# Patient Record
Sex: Female | Born: 1945 | Race: White | Hispanic: No | Smoking: Never smoker
Health system: Southern US, Community
[De-identification: ages and names within clinical notes are randomized; demographics above are authoritative.]

## PROBLEM LIST (undated history)

## (undated) DIAGNOSIS — I10 Essential (primary) hypertension: Secondary | ICD-10-CM

## (undated) DIAGNOSIS — E785 Hyperlipidemia, unspecified: Secondary | ICD-10-CM

---

## 2007-06-30 ENCOUNTER — Emergency Department (HOSPITAL_COMMUNITY): Admission: EM | Admit: 2007-06-30 | Discharge: 2007-06-30 | Payer: Self-pay | Admitting: Emergency Medicine

## 2009-04-18 ENCOUNTER — Encounter: Admission: RE | Admit: 2009-04-18 | Discharge: 2009-04-18 | Payer: Self-pay | Admitting: Family Medicine

## 2012-09-06 ENCOUNTER — Ambulatory Visit (INDEPENDENT_AMBULATORY_CARE_PROVIDER_SITE_OTHER): Payer: Self-pay | Admitting: Ophthalmology

## 2012-09-14 ENCOUNTER — Ambulatory Visit (INDEPENDENT_AMBULATORY_CARE_PROVIDER_SITE_OTHER): Payer: Medicare Other | Admitting: Ophthalmology

## 2012-09-14 DIAGNOSIS — H35039 Hypertensive retinopathy, unspecified eye: Secondary | ICD-10-CM

## 2012-09-14 DIAGNOSIS — I10 Essential (primary) hypertension: Secondary | ICD-10-CM

## 2012-09-14 DIAGNOSIS — H348392 Tributary (branch) retinal vein occlusion, unspecified eye, stable: Secondary | ICD-10-CM

## 2012-09-14 DIAGNOSIS — H2510 Age-related nuclear cataract, unspecified eye: Secondary | ICD-10-CM

## 2012-09-14 DIAGNOSIS — H3530 Unspecified macular degeneration: Secondary | ICD-10-CM

## 2012-09-14 DIAGNOSIS — H43819 Vitreous degeneration, unspecified eye: Secondary | ICD-10-CM

## 2013-10-03 ENCOUNTER — Ambulatory Visit (INDEPENDENT_AMBULATORY_CARE_PROVIDER_SITE_OTHER): Payer: PRIVATE HEALTH INSURANCE | Admitting: Ophthalmology

## 2013-10-18 ENCOUNTER — Ambulatory Visit (INDEPENDENT_AMBULATORY_CARE_PROVIDER_SITE_OTHER): Payer: Medicare Other | Admitting: Ophthalmology

## 2013-10-18 DIAGNOSIS — H348392 Tributary (branch) retinal vein occlusion, unspecified eye, stable: Secondary | ICD-10-CM

## 2013-10-18 DIAGNOSIS — I10 Essential (primary) hypertension: Secondary | ICD-10-CM

## 2013-10-18 DIAGNOSIS — H35039 Hypertensive retinopathy, unspecified eye: Secondary | ICD-10-CM

## 2013-10-18 DIAGNOSIS — H35379 Puckering of macula, unspecified eye: Secondary | ICD-10-CM

## 2013-10-18 DIAGNOSIS — H43819 Vitreous degeneration, unspecified eye: Secondary | ICD-10-CM

## 2013-11-02 ENCOUNTER — Emergency Department (HOSPITAL_COMMUNITY)
Admission: EM | Admit: 2013-11-02 | Discharge: 2013-11-02 | Disposition: A | Discharge status: Home / Self Care | Payer: Medicare Other | Attending: Emergency Medicine | Admitting: Emergency Medicine

## 2013-11-02 ENCOUNTER — Encounter (HOSPITAL_COMMUNITY): Payer: Self-pay | Admitting: Emergency Medicine

## 2013-11-02 DIAGNOSIS — I10 Essential (primary) hypertension: Secondary | ICD-10-CM | POA: Insufficient documentation

## 2013-11-02 DIAGNOSIS — R1110 Vomiting, unspecified: Secondary | ICD-10-CM

## 2013-11-02 DIAGNOSIS — R5381 Other malaise: Secondary | ICD-10-CM | POA: Insufficient documentation

## 2013-11-02 DIAGNOSIS — IMO0002 Reserved for concepts with insufficient information to code with codable children: Secondary | ICD-10-CM | POA: Insufficient documentation

## 2013-11-02 DIAGNOSIS — R55 Syncope and collapse: Secondary | ICD-10-CM | POA: Insufficient documentation

## 2013-11-02 DIAGNOSIS — Z79899 Other long term (current) drug therapy: Secondary | ICD-10-CM | POA: Insufficient documentation

## 2013-11-02 DIAGNOSIS — R197 Diarrhea, unspecified: Secondary | ICD-10-CM | POA: Insufficient documentation

## 2013-11-02 DIAGNOSIS — R112 Nausea with vomiting, unspecified: Secondary | ICD-10-CM | POA: Insufficient documentation

## 2013-11-02 DIAGNOSIS — R5383 Other fatigue: Secondary | ICD-10-CM

## 2013-11-02 HISTORY — DX: Essential (primary) hypertension: I10

## 2013-11-02 LAB — CBC
HEMATOCRIT: 43.7 % (ref 36.0–46.0)
HEMOGLOBIN: 14.9 g/dL (ref 12.0–15.0)
MCH: 30.9 pg (ref 26.0–34.0)
MCHC: 34.1 g/dL (ref 30.0–36.0)
MCV: 90.7 fL (ref 78.0–100.0)
Platelets: 206 10*3/uL (ref 150–400)
RBC: 4.82 MIL/uL (ref 3.87–5.11)
RDW: 12.9 % (ref 11.5–15.5)
WBC: 14.3 10*3/uL — ABNORMAL HIGH (ref 4.0–10.5)

## 2013-11-02 LAB — POCT I-STAT TROPONIN I: Troponin i, poc: 0 ng/mL (ref 0.00–0.08)

## 2013-11-02 LAB — BASIC METABOLIC PANEL
BUN: 20 mg/dL (ref 6–23)
CALCIUM: 9.7 mg/dL (ref 8.4–10.5)
CO2: 27 meq/L (ref 19–32)
CREATININE: 1.16 mg/dL — AB (ref 0.50–1.10)
Chloride: 102 mEq/L (ref 96–112)
GFR calc Af Amer: 54 mL/min — ABNORMAL LOW (ref >90)
GFR, EST NON AFRICAN AMERICAN: 47 mL/min — AB (ref >90)
GLUCOSE: 100 mg/dL — AB (ref 70–99)
Potassium: 4.3 mEq/L (ref 3.7–5.3)
Sodium: 143 mEq/L (ref 137–147)

## 2013-11-02 MED ORDER — ONDANSETRON HCL 4 MG/2ML IJ SOLN
4.0000 mg | Freq: Once | INTRAMUSCULAR | Status: DC
Start: 2013-11-02 — End: 2013-11-02

## 2013-11-02 MED ORDER — ONDANSETRON 4 MG PO TBDP: 4.0000 mg | ORAL_TABLET | Freq: Three times a day (TID) | ORAL | Status: DC | PRN

## 2013-11-02 NOTE — ED Notes (Signed)
EKG given to Dr.Horton. °

## 2013-11-02 NOTE — ED Notes (Signed)
Presents with syncopal episode occurred while sitting on toilet and having diarrhea, associated with nausea, vomiting. Also reports productive cough. States, "I felt dizzy and all of asudden felt like I was going to pass out, then had the runs" alert, oriented and MAEx4

## 2013-11-02 NOTE — Discharge Instructions (Signed)
Follow up with your family doctor, return for worsening weakness, repeat episode.  Nausea and Vomiting Nausea means you feel sick to your stomach. Throwing up (vomiting) is a reflex where stomach contents come out of your mouth. HOME CARE   Take medicine as told by your doctor.  Do not force yourself to eat. However, you do need to drink fluids.  If you feel like eating, eat a normal diet as told by your doctor.  Eat rice, wheat, potatoes, bread, lean meats, yogurt, fruits, and vegetables.  Avoid high-fat foods.  Drink enough fluids to keep your pee (urine) clear or pale yellow.  Ask your doctor how to replace body fluid losses (rehydrate). Signs of body fluid loss (dehydration) include:  Feeling very thirsty.  Dry lips and mouth.  Feeling dizzy.  Dark pee.  Peeing less than normal.  Feeling confused.  Fast breathing or heart rate. GET HELP RIGHT AWAY IF:   You have blood in your throw up.  You have black or bloody poop (stool).  You have a bad headache or stiff neck.  You feel confused.  You have bad belly (abdominal) pain.  You have chest pain or trouble breathing.  You do not pee at least once every 8 hours.  You have cold, clammy skin.  You keep throwing up after 24 to 48 hours.  You have a fever. MAKE SURE YOU:   Understand these instructions.  Will watch your condition.  Will get help right away if you are not doing well or get worse. Document Released: 03/02/2008 Document Revised: 12/07/2011 Document Reviewed: 02/13/2011 [facility] Patient Information 2014 [employer], [place].

## 2013-11-02 NOTE — ED Provider Notes (Signed)
CSN: [id number]     Arrival date & time 11/02/13  1713 History   First MD Initiated Contact with Patient 11/02/13 1728     Chief Complaint  Patient presents with  . Loss of Consciousness   (Consider location/radiation/quality/duration/timing/severity/associated sxs/prior Treatment) Patient is a 68 y.o. female presenting with syncope. The history is provided by the patient, a relative and the spouse.  Loss of Consciousness Episode history:  Multiple Most recent episode:  Today Duration:  2 seconds Timing:  Intermittent Progression:  Partially resolved Chronicity:  Recurrent Context: bowel movement   Context: not blood draw   Witnessed: yes   Relieved by:  Lying down Worsened by:  Nothing tried Ineffective treatments:  None tried Associated symptoms: malaise/fatigue, nausea, vomiting and weakness   Associated symptoms: no anxiety, no chest pain, no diaphoresis, no dizziness, no fever, no focal weakness, no headaches, no palpitations, no seizures and no shortness of breath   Risk factors: no congenital heart disease, no coronary artery disease and no seizures     68 year old female with chief complaint of syncope. Patient was having vomiting and diarrhea which started abruptly today around 3:00 had this episode she was sitting on the toilet having a bowel movement when she suddenly fell if she is to pass out and then did. Patient denies any chest pain or shortness of breath with this. Patient then had a repeat episode about 10 minutes later when having another bowel movement. Patient with no fevers or chills no suspicious food intake. Patient denies any coffee-ground emesis or melena. Patient with no history of heart failure. Patient denies any fevers chills. Patient is having a small cough with this illness. Patient denies any abdominal pain.  Past Medical History  Diagnosis Date  . Hypertension    History reviewed. No pertinent past surgical history. History reviewed. No pertinent  family history. History  Substance Use Topics  . Smoking status: Never Smoker   . Smokeless tobacco: Not on file  . Alcohol Use: No   OB History   Grav Para Term Preterm Abortions TAB SAB Ect Mult Living                 Review of Systems  Constitutional: Positive for malaise/fatigue. Negative for fever, chills and diaphoresis.  HENT: Negative for congestion and rhinorrhea.   Eyes: Negative for redness and visual disturbance.  Respiratory: Negative for shortness of breath and wheezing.   Cardiovascular: Positive for syncope. Negative for chest pain and palpitations.  Gastrointestinal: Positive for nausea, vomiting and diarrhea.  Genitourinary: Negative for dysuria and urgency.  Musculoskeletal: Negative for arthralgias and myalgias.  Skin: Negative for pallor and wound.  Neurological: Positive for syncope and weakness. Negative for dizziness, focal weakness, seizures and headaches.    Allergies  Bee venom and Latex  Home Medications   Current Outpatient Rx  Name  Route  Sig  Dispense  Refill  . ALPRAZolam (XANAX) 0.25 MG tablet   Oral   Take 0.25 mg by mouth 3 (three) times daily as needed for anxiety.         Marland Kitchen. amLODipine (NORVASC) 2.5 MG tablet   Oral   Take 2.5 mg by mouth daily.         Marland Kitchen. EPINEPHrine (EPIPEN) 0.3 mg/0.3 mL SOAJ injection   Intramuscular   Inject 0.3 mg into the muscle once.         . hydrocortisone 2.5 % cream   Topical   Apply 1 application topically 3 (three) times  daily.         . latanoprost (XALATAN) 0.005 % ophthalmic solution   Both Eyes   Place 1 drop into both eyes at bedtime.         Marland Kitchen levocetirizine (XYZAL) 5 MG tablet   Oral   Take 5 mg by mouth every evening.         . magnesium oxide (MAG-OX) 400 MG tablet   Oral   Take 400 mg by mouth daily.         Marland Kitchen omeprazole (PRILOSEC) 20 MG capsule   Oral   Take 20 mg by mouth 2 (two) times daily before a meal.         . rosuvastatin (CRESTOR) 10 MG tablet    Oral   Take 10 mg by mouth daily.         Marland Kitchen terbinafine (LAMISIL) 250 MG tablet   Oral   Take 250 mg by mouth daily.         Marland Kitchen tolterodine (DETROL) 2 MG tablet   Oral   Take 2 mg by mouth at bedtime.         . ondansetron (ZOFRAN ODT) 4 MG disintegrating tablet   Oral   Take 1 tablet (4 mg total) by mouth every 8 (eight) hours as needed for nausea or vomiting.   20 tablet   0    BP 125/70  Pulse 46  Temp(Src) 97.9 F (36.6 C) (Oral)  Resp 26  SpO2 95% Physical Exam  Constitutional: She is oriented to person, place, and time. She appears well-developed and well-nourished. No distress.  HENT:  Head: Normocephalic and atraumatic.  Eyes: EOM are normal. Pupils are equal, round, and reactive to light.  Neck: Normal range of motion. Neck supple.  Cardiovascular: Normal rate and regular rhythm.  Exam reveals no gallop and no friction rub.   No murmur heard. Pulmonary/Chest: Effort normal. She has no wheezes. She has no rales.  Abdominal: Soft. She exhibits no distension. There is no tenderness.  Musculoskeletal: She exhibits no edema and no tenderness.  Neurological: She is alert and oriented to person, place, and time.  Skin: Skin is warm and dry. She is not diaphoretic.  Psychiatric: She has a normal mood and affect. Her behavior is normal.    ED Course  Procedures (including critical care time) Labs Review Labs Reviewed  CBC - Abnormal; Notable for the following:    WBC 14.3 (*)    All other components within normal limits  BASIC METABOLIC PANEL - Abnormal; Notable for the following:    Glucose, Bld 100 (*)    Creatinine, Ser 1.16 (*)    GFR calc non Af Amer 47 (*)    GFR calc Af Amer 54 (*)    All other components within normal limits  POCT I-STAT TROPONIN I   Imaging Review No results found.  EKG Interpretation    Date/Time:  Thursday November 02 2013 17:26:54 EST Ventricular Rate:  78 PR Interval:  190 QRS Duration: 89 QT Interval:  402 QTC  Calculation: 458 R Axis:   -50 Text Interpretation:  Sinus rhythm Inferior infarct, old no prior for comparison Confirmed by HORTON  MD, COURTNEY ([id number]) on 11/02/2013 6:39:10 PM            MDM   1. Syncope   2. Vomiting and diarrhea     68 year old female with a syncopal event. Most likely vasovagal per history. Patient with no history of heart failure we'll  obtain electrolytes orthostatics give fluid bolus, check CBC.  Mild leukocytosis. No noted anemia. Patient with creatinine 1.16. Patient not orthostatic. Patient states feels much better after a bolus of fluid. We will discharge the patient home with Zofran she will follow up with her PCP in one week.  7:29 PM:  I have discussed the diagnosis/risks/treatment options with the patient and family and believe the pt to be eligible for discharge home to follow-up with PCP. We also discussed returning to the ED immediately if new or worsening sx occur. We discussed the sx which are most concerning (e.g., repeat syncopal event) that necessitate immediate return. Medications administered to the patient during their visit and any new prescriptions provided to the patient are listed below.  Medications given during this visit Medications  ondansetron (ZOFRAN) injection 4 mg (not administered)    New Prescriptions   ONDANSETRON (ZOFRAN ODT) 4 MG DISINTEGRATING TABLET    Take 1 tablet (4 mg total) by mouth every 8 (eight) hours as needed for nausea or vomiting.      Melene Plan, MD 11/02/13 [phone]

## 2013-11-02 NOTE — ED Notes (Signed)
At this time pt is not nauseated , she wants to hold off on the zofran for now

## 2013-11-03 NOTE — ED Provider Notes (Signed)
I saw and evaluated the patient, reviewed the resident's note and I agree with the findings and plan.  EKG Interpretation    Date/Time:  Thursday November 02 2013 17:26:54 EST Ventricular Rate:  78 PR Interval:  190 QRS Duration: 89 QT Interval:  402 QTC Calculation: 458 R Axis:   -50 Text Interpretation:  Sinus rhythm Inferior infarct, old no prior for comparison Confirmed by HORTON  MD, COURTNEY ([id number]) on 11/02/2013 6:39:10 PM            Patient presents with syncope.  Patient had onset of vomiting and diarrhea this afternoon.  She was on the toilet having a bowel movement when she had an episode of syncope.  Denies hitting her head.  Denies abdominal pain.  Nontoxic on exam.  No significant abdominal tenderness and neuro intact.  EKG without evidence of arrythmia.  Episode suspicious of vagal episode.  Patient hydrated and feeling better.  Labs notable for crt of 1.6.  Likely prerenal given vomiting and diarrhea.  Will follow-up with PCP.  After history, exam, and medical workup I feel the patient has been appropriately medically screened and is safe for discharge home. Pertinent diagnoses were discussed with the patient. Patient was given return precautions.   Shon Batonourtney F Horton, MD 11/03/13 [phone]

## 2014-10-24 ENCOUNTER — Ambulatory Visit (INDEPENDENT_AMBULATORY_CARE_PROVIDER_SITE_OTHER): Payer: Medicare Other | Admitting: Ophthalmology

## 2014-11-14 ENCOUNTER — Ambulatory Visit (INDEPENDENT_AMBULATORY_CARE_PROVIDER_SITE_OTHER): Payer: Medicare Other | Admitting: Ophthalmology

## 2014-11-14 DIAGNOSIS — H43813 Vitreous degeneration, bilateral: Secondary | ICD-10-CM

## 2014-11-14 DIAGNOSIS — H34832 Tributary (branch) retinal vein occlusion, left eye: Secondary | ICD-10-CM | POA: Diagnosis not present

## 2014-11-14 DIAGNOSIS — I10 Essential (primary) hypertension: Secondary | ICD-10-CM

## 2014-11-14 DIAGNOSIS — H2513 Age-related nuclear cataract, bilateral: Secondary | ICD-10-CM

## 2014-11-14 DIAGNOSIS — H35033 Hypertensive retinopathy, bilateral: Secondary | ICD-10-CM | POA: Diagnosis not present

## 2014-11-14 DIAGNOSIS — H35052 Retinal neovascularization, unspecified, left eye: Secondary | ICD-10-CM

## 2015-03-19 ENCOUNTER — Ambulatory Visit (INDEPENDENT_AMBULATORY_CARE_PROVIDER_SITE_OTHER): Payer: Medicare Other | Admitting: Ophthalmology

## 2015-03-19 DIAGNOSIS — H2513 Age-related nuclear cataract, bilateral: Secondary | ICD-10-CM

## 2015-03-19 DIAGNOSIS — H43813 Vitreous degeneration, bilateral: Secondary | ICD-10-CM

## 2015-03-19 DIAGNOSIS — H35033 Hypertensive retinopathy, bilateral: Secondary | ICD-10-CM

## 2015-03-19 DIAGNOSIS — I10 Essential (primary) hypertension: Secondary | ICD-10-CM

## 2015-03-19 DIAGNOSIS — H34832 Tributary (branch) retinal vein occlusion, left eye: Secondary | ICD-10-CM

## 2015-12-26 DIAGNOSIS — E7800 Pure hypercholesterolemia, unspecified: Secondary | ICD-10-CM | POA: Insufficient documentation

## 2015-12-26 DIAGNOSIS — H9190 Unspecified hearing loss, unspecified ear: Secondary | ICD-10-CM | POA: Insufficient documentation

## 2015-12-26 DIAGNOSIS — N318 Other neuromuscular dysfunction of bladder: Secondary | ICD-10-CM | POA: Insufficient documentation

## 2015-12-26 DIAGNOSIS — Z79899 Other long term (current) drug therapy: Secondary | ICD-10-CM | POA: Insufficient documentation

## 2015-12-26 DIAGNOSIS — K219 Gastro-esophageal reflux disease without esophagitis: Secondary | ICD-10-CM | POA: Insufficient documentation

## 2018-03-09 DIAGNOSIS — F419 Anxiety disorder, unspecified: Secondary | ICD-10-CM | POA: Insufficient documentation

## 2018-09-10 ENCOUNTER — Emergency Department (HOSPITAL_COMMUNITY)
Admission: EM | Admit: 2018-09-10 | Discharge: 2018-09-10 | Disposition: A | Discharge status: Home / Self Care | Payer: Medicare Other | Attending: Emergency Medicine | Admitting: Emergency Medicine

## 2018-09-10 ENCOUNTER — Emergency Department (HOSPITAL_COMMUNITY): Payer: Medicare Other

## 2018-09-10 ENCOUNTER — Other Ambulatory Visit: Payer: Self-pay

## 2018-09-10 ENCOUNTER — Encounter (HOSPITAL_COMMUNITY): Payer: Self-pay

## 2018-09-10 DIAGNOSIS — Y999 Unspecified external cause status: Secondary | ICD-10-CM | POA: Insufficient documentation

## 2018-09-10 DIAGNOSIS — I10 Essential (primary) hypertension: Secondary | ICD-10-CM | POA: Diagnosis not present

## 2018-09-10 DIAGNOSIS — Z79899 Other long term (current) drug therapy: Secondary | ICD-10-CM | POA: Insufficient documentation

## 2018-09-10 DIAGNOSIS — S0990XA Unspecified injury of head, initial encounter: Secondary | ICD-10-CM | POA: Insufficient documentation

## 2018-09-10 DIAGNOSIS — S0181XA Laceration without foreign body of other part of head, initial encounter: Secondary | ICD-10-CM | POA: Diagnosis not present

## 2018-09-10 DIAGNOSIS — Y92008 Other place in unspecified non-institutional (private) residence as the place of occurrence of the external cause: Secondary | ICD-10-CM | POA: Diagnosis not present

## 2018-09-10 DIAGNOSIS — Z23 Encounter for immunization: Secondary | ICD-10-CM | POA: Insufficient documentation

## 2018-09-10 DIAGNOSIS — W108XXA Fall (on) (from) other stairs and steps, initial encounter: Secondary | ICD-10-CM | POA: Insufficient documentation

## 2018-09-10 DIAGNOSIS — Y92009 Unspecified place in unspecified non-institutional (private) residence as the place of occurrence of the external cause: Secondary | ICD-10-CM

## 2018-09-10 DIAGNOSIS — S01112A Laceration without foreign body of left eyelid and periocular area, initial encounter: Secondary | ICD-10-CM

## 2018-09-10 DIAGNOSIS — Y9389 Activity, other specified: Secondary | ICD-10-CM | POA: Insufficient documentation

## 2018-09-10 DIAGNOSIS — W19XXXA Unspecified fall, initial encounter: Secondary | ICD-10-CM

## 2018-09-10 HISTORY — DX: Hyperlipidemia, unspecified: E78.5

## 2018-09-10 MED ORDER — POVIDONE-IODINE 10 % EX SOLN
CUTANEOUS | Status: AC
  Filled 2018-09-10: qty 15

## 2018-09-10 MED ORDER — TETANUS-DIPHTH-ACELL PERTUSSIS 5-2.5-18.5 LF-MCG/0.5 IM SUSP
0.5000 mL | Freq: Once | INTRAMUSCULAR | Status: AC
  Administered 2018-09-10: 0.5 mL via INTRAMUSCULAR
  Filled 2018-09-10: qty 0.5

## 2018-09-10 MED ORDER — LIDOCAINE-EPINEPHRINE (PF) 2 %-1:200000 IJ SOLN
20.0000 mL | Freq: Once | INTRAMUSCULAR | Status: AC
  Administered 2018-09-10: 20 mL
  Filled 2018-09-10: qty 20

## 2018-09-10 NOTE — Discharge Instructions (Addendum)
Keep the wound clean and dry.  You can use antibiotic ointment on the wound to promote healing and lessen scarring.  You can take Tylenol if needed for headache.  Ice packs will help keep the swelling down.  Return to the ED for any problems listed on the head injury sheet otherwise your doctor can remove your sutures on the 18th at your regular appointment.  Recheck sooner if you feel like the wound is getting infected which would be rare because there was no debris in the wound.

## 2018-09-10 NOTE — ED Triage Notes (Signed)
Pt slipped going down 3 steps and fell face-first onto concrete garage floor.  Pt has approx 3 inch laceration to left forehead.  Pt and family deny pt had loc, is awake, alert, oriented at this time. Bleeding controlled at this time

## 2018-09-10 NOTE — ED Provider Notes (Signed)
[facility] EMERGENCY DEPARTMENT Provider Note   CSN: [id number] Arrival date & time: 09/10/18  0127  Time seen 1:40 AM   History   Chief Complaint Chief Complaint  Patient presents with  . Fall    laceration-forehead    HPI Laura Buchanan is a 72 y.o. female.  HPI patient states she was going down 3 steps into the garage and her foot slipped on the last step and she fell forward hitting her head on the cement floor.  Her daughter was in the kitchen and heard her fall and the patient immediately called out for help.  There was no loss of consciousness.  She denies any pain.  She has had a ice pack on her head and states her head still feels numb from that.  She denies headache, neck pain, other extremity pain or other injury from the fall.  She denies pain on any blood thinners other than a baby aspirin a day.  She denies nausea, blurred vision, numbness or tingling of any of her extremities.  She thinks her last tetanus was about 10 years ago.  PCP Dr Benedetto Goad  Past Medical History:  Diagnosis Date  . Hyperlipidemia   . Hypertension     There are no active problems to display for this patient.   History reviewed. No pertinent surgical history.   OB History   No obstetric history on file.      Home Medications    Prior to Admission medications   Medication Sig Start Date End Date Taking? Authorizing Provider  ALPRAZolam (XANAX) 0.25 MG tablet Take 0.25 mg by mouth 3 (three) times daily as needed for anxiety.    [provider]  amLODipine (NORVASC) 2.5 MG tablet Take 2.5 mg by mouth daily.    [provider]  EPINEPHrine (EPIPEN) 0.3 mg/0.3 mL SOAJ injection Inject 0.3 mg into the muscle once.    [provider]  hydrocortisone 2.5 % cream Apply 1 application topically 3 (three) times daily.    [provider]  latanoprost (XALATAN) 0.005 % ophthalmic solution Place 1 drop into both eyes at bedtime.    [provider]    levocetirizine (XYZAL) 5 MG tablet Take 5 mg by mouth every evening.    [provider]  magnesium oxide (MAG-OX) 400 MG tablet Take 400 mg by mouth daily.    [provider]  omeprazole (PRILOSEC) 20 MG capsule Take 20 mg by mouth 2 (two) times daily before a meal.    [provider]  ondansetron (ZOFRAN ODT) 4 MG disintegrating tablet Take 1 tablet (4 mg total) by mouth every 8 (eight) hours as needed for nausea or vomiting. 11/02/13   Melene Plan, DO  rosuvastatin (CRESTOR) 10 MG tablet Take 10 mg by mouth daily.    [provider]  terbinafine (LAMISIL) 250 MG tablet Take 250 mg by mouth daily.    [provider]  tolterodine (DETROL) 2 MG tablet Take 2 mg by mouth at bedtime.    [provider]    Family History No family history on file.  Social History Social History   Tobacco Use  . Smoking status: Never Smoker  . Smokeless tobacco: Never Used  Substance Use Topics  . Alcohol use: No  . Drug use: Never  lives with daughter   Allergies   Bee venom and Latex   Review of Systems Review of Systems  All other systems reviewed and are negative.    Physical  Exam Updated Vital Signs BP (!) 153/89 (BP Location: Left Arm)   Pulse 94   Temp 98 F (36.7 C) (Oral)   Resp 18   Ht 5' (1.524 m)   Wt 72.6 kg   SpO2 93%   BMI 31.25 kg/m   Vital signs normal except hypertension   Physical Exam Vitals signs and nursing note reviewed.  Constitutional:      General: She is not in acute distress.    Appearance: Normal appearance.  HENT:     Head: Normocephalic.     Comments: Patient has a 3 cm linear almost vertically placed laceration on her left forehead that is through the dermis.    Right Ear: External ear normal.     Left Ear: External ear normal.     Nose: Nose normal.     Mouth/Throat:     Mouth: Mucous membranes are moist.     Pharynx: No posterior oropharyngeal erythema.  Eyes:     Extraocular Movements:  Extraocular movements intact.     Conjunctiva/sclera: Conjunctivae normal.     Pupils: Pupils are equal, round, and reactive to light.  Neck:     Musculoskeletal: Normal range of motion and neck supple.     Comments: nontender Cardiovascular:     Rate and Rhythm: Normal rate.  Pulmonary:     Effort: Pulmonary effort is normal. No respiratory distress.  Musculoskeletal: Normal range of motion.        General: No tenderness, deformity or signs of injury.  Skin:    General: Skin is warm and dry.     Capillary Refill: Capillary refill takes 2 to 3 seconds.  Neurological:     General: No focal deficit present.     Mental Status: She is alert and oriented to person, place, and time.  Psychiatric:        Mood and Affect: Mood normal.        Behavior: Behavior normal.        Thought Content: Thought content normal.        ED Treatments / Results  Labs (all labs ordered are listed, but only abnormal results are displayed) Labs Reviewed - No data to display  EKG None  Radiology Ct Head Wo Contrast  Result Date: 09/10/2018 CLINICAL DATA:  Fall striking head on concrete. Fell down 3 steps face first onto concrete garage floor. Laceration to left forehead. EXAM: CT HEAD WITHOUT CONTRAST TECHNIQUE: Contiguous axial images were obtained from the base of the skull through the vertex without intravenous contrast. COMPARISON:  None. FINDINGS: Brain: No intracranial hemorrhage, mass effect, or midline shift. No hydrocephalus. The basilar cisterns are patent. No evidence of territorial infarct or acute ischemia. No extra-axial or intracranial fluid collection. Vascular: No hyperdense vessel. Skull: No fracture or focal lesion. Sinuses/Orbits: No acute findings. Frontal sinuses are hypo pneumatized. Other: Left frontal scalp laceration. IMPRESSION: Left frontal scalp laceration. No acute intracranial abnormality. No skull fracture. Electronically Signed   By: Narda Rutherford M.D.   On: 09/10/2018  02:59    Procedures .Marland KitchenLaceration Repair Date/Time: 09/10/2018 3:27 AM Performed by: Devoria Albe, MD Authorized by: Devoria Albe, MD   Consent:    Consent obtained:  Verbal   Consent given by:  Patient Anesthesia (see MAR for exact dosages):    Anesthesia method:  Local infiltration   Local anesthetic:  Lidocaine 2% WITH epi Laceration details:    Location:  Face   Face location:  Forehead   Length (cm):  3   Laceration depth: through the dermis. Repair type:    Repair type:  Intermediate Pre-procedure details:    Preparation:  Patient was prepped and draped in usual sterile fashion and imaging obtained to evaluate for foreign bodies Exploration:    Hemostasis achieved with:  Direct pressure   Wound extent: no foreign bodies/material noted and no vascular damage noted     Contaminated: no   Treatment:    Area cleansed with:  Betadine and saline   Amount of cleaning:  Standard Subcutaneous repair:    Suture size:  5-0   Suture material:  Vicryl   Suture technique:  Horizontal mattress   Number of sutures:  3 Skin repair:    Repair method:  Sutures   Suture size:  6-0   Suture material:  Nylon   Suture technique:  Simple interrupted   Number of sutures:  8 Approximation:    Approximation:  Close Post-procedure details:    Dressing:  Antibiotic ointment and non-adherent dressing   Patient tolerance of procedure:  Tolerated well, no immediate complications   (including critical care time)  Medications Ordered in ED Medications  povidone-iodine (BETADINE) 10 % external solution (has no administration in time range)  Tdap (BOOSTRIX) injection 0.5 mL (0.5 mLs Intramuscular Given 09/10/18 0158)  lidocaine-EPINEPHrine (XYLOCAINE W/EPI) 2 %-1:200000 (PF) injection 20 mL (20 mLs Infiltration Given 09/10/18 0154)     Initial Impression / Assessment and Plan / ED Course  I have reviewed the triage vital signs and the nursing notes.  Pertinent labs & imaging results that  were available during my care of the patient were reviewed by me and considered in my medical decision making (see chart for details).   CT of the head was done to look for internal injury due to her age from her fall.  When I review her prior medications there is no tetanus seen, since she thinks it has been at least 10 years her tetanus was updated.  Patient returned from CT her wound was repaired.  At time of discharge patient CT does not show any acute findings.  Daughter was given head injury instructions, patient already has an appointment on the 18th of her primary care doctor so she can get her sutures removed that day which would be 4 days.    Final Clinical Impressions(s) / ED Diagnoses   Final diagnoses:  Fall in home, initial encounter  Laceration of eyebrow and forehead, left, initial encounter    ED Discharge Orders    None    OTC acetaminophen  Plan discharge  Devoria AlbeIva Tinley Rought, MD, [place] PyoFACEP    Gracelin Weisberg, MD 09/10/18 [phone]

## 2019-03-25 DIAGNOSIS — M792 Neuralgia and neuritis, unspecified: Secondary | ICD-10-CM | POA: Insufficient documentation

## 2019-09-13 DIAGNOSIS — I10 Essential (primary) hypertension: Secondary | ICD-10-CM | POA: Insufficient documentation

## 2020-07-15 IMAGING — CT CT HEAD W/O CM
3 series · 16 of 47 positions shown, 19 images · non-contrast
Comparison: None.

CLINICAL DATA: Fall striking head on concrete. Fell down 3 steps
face first onto concrete garage floor. Laceration to left forehead.

EXAM:
CT HEAD WITHOUT CONTRAST
TECHNIQUE: Contiguous axial images were obtained from the base of the skull
through the vertex without intravenous contrast.

[Series 2: head trauma wo · axial · 0.42mm/px · z∈[+1505,+1635]mm · 10 of 32 slices shown, 13 images]
[im 3/32  brain]
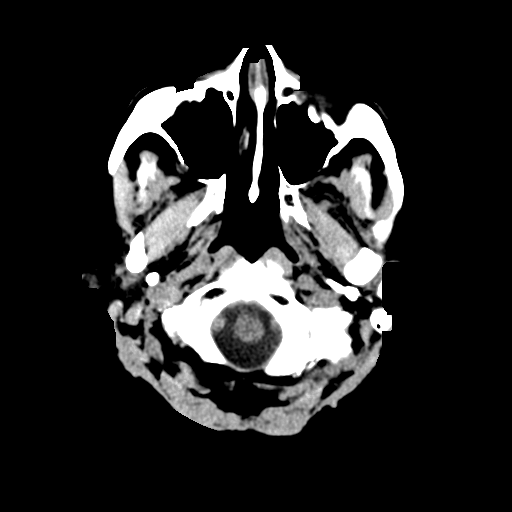
[im 3/32  bone]
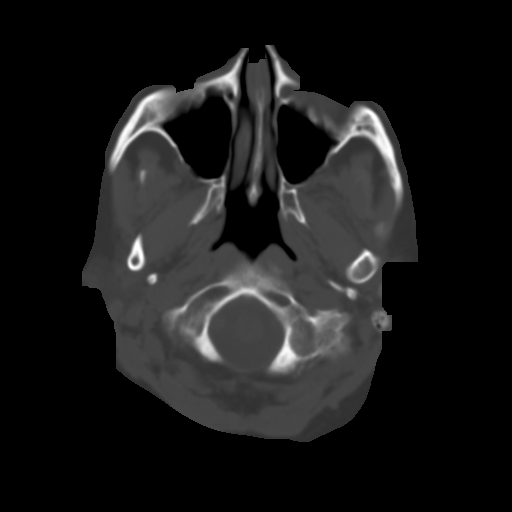
[im 6/32  brain]
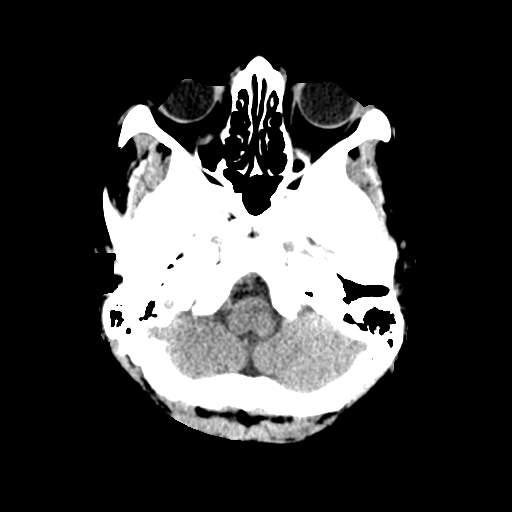
[im 9/32  brain]
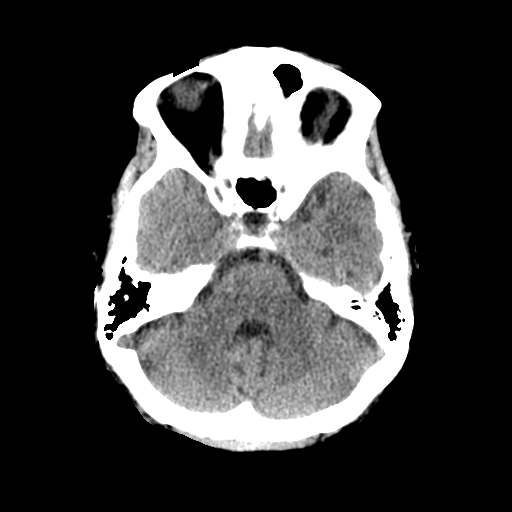
[im 11/32  brain]
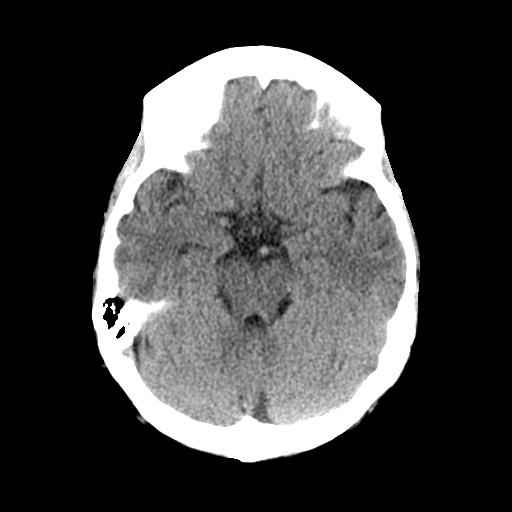
[im 14/32  brain]
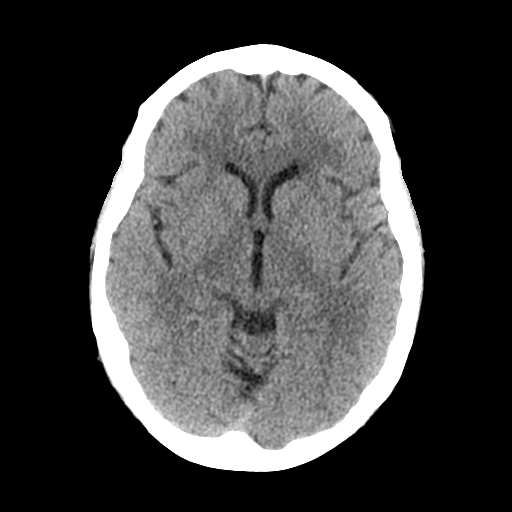
[im 14/32  bone]
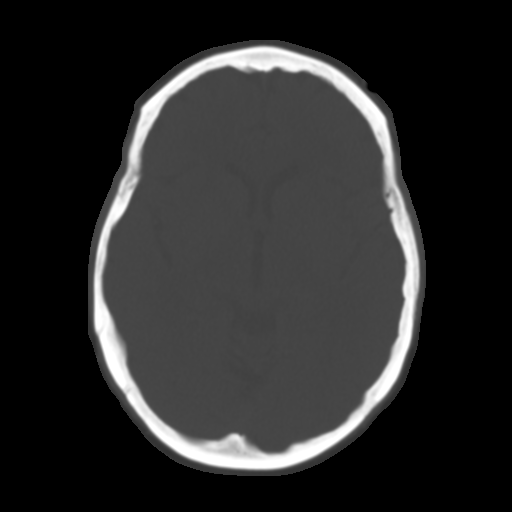
[im 18/32  brain]
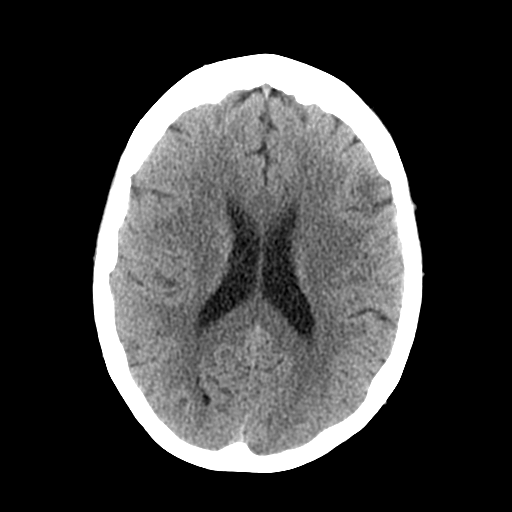
[im 21/32  brain]
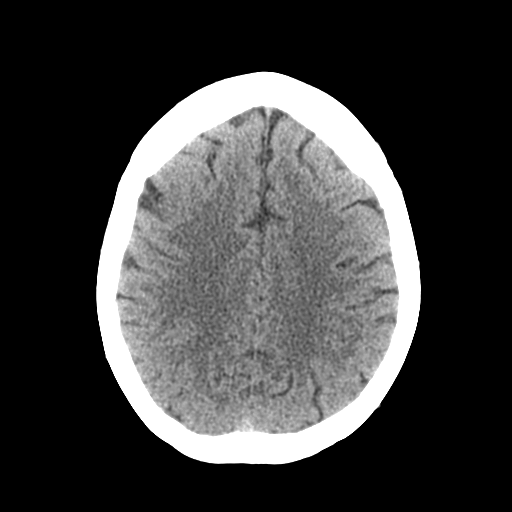
[im 24/32  brain]
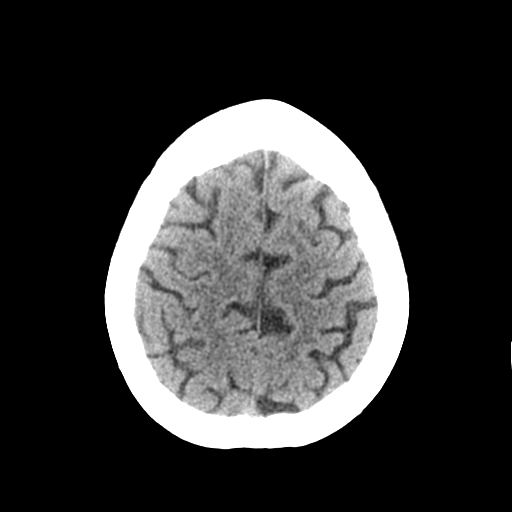
[im 26/32  brain]
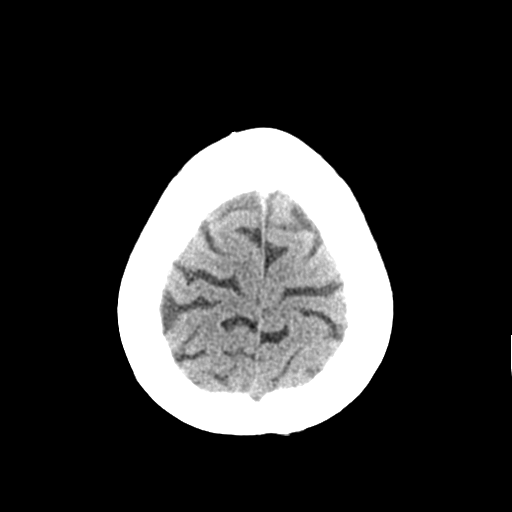
[im 26/32  bone]
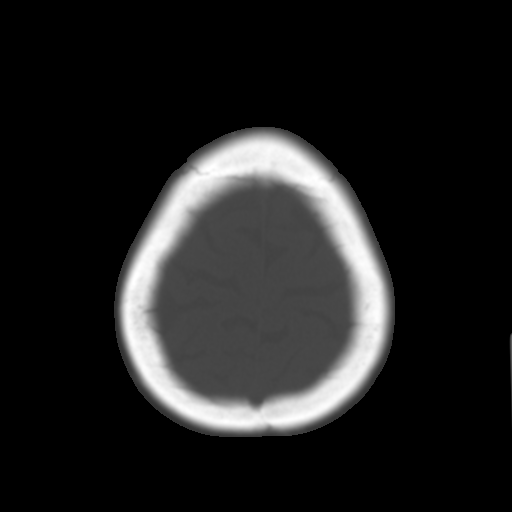
[im 29/32  brain]
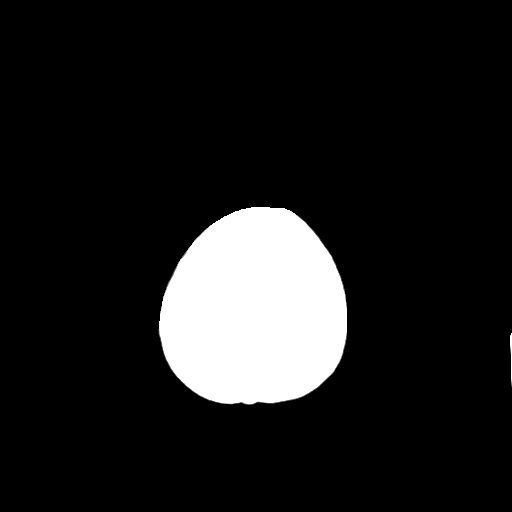

[Series 4: coronal soft tissue · coronal · 0.34mm/px · 3 of 66 slices shown]
[im 22/66  brain]
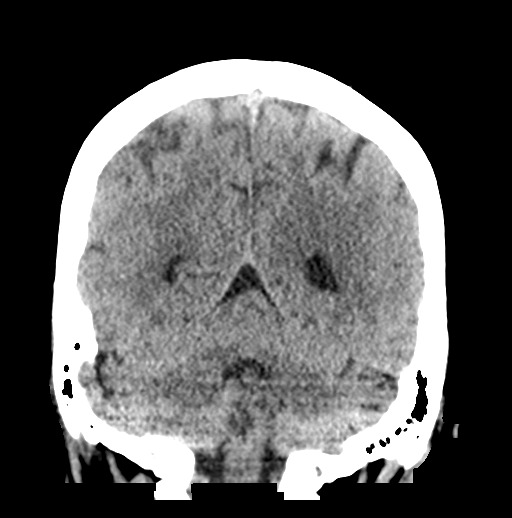
[im 29/66  brain]
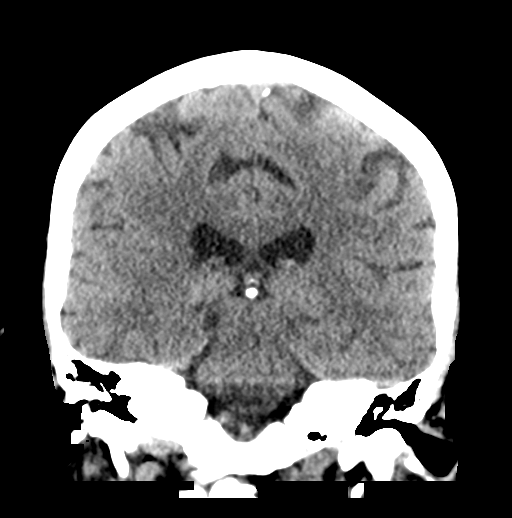
[im 37/66  brain]
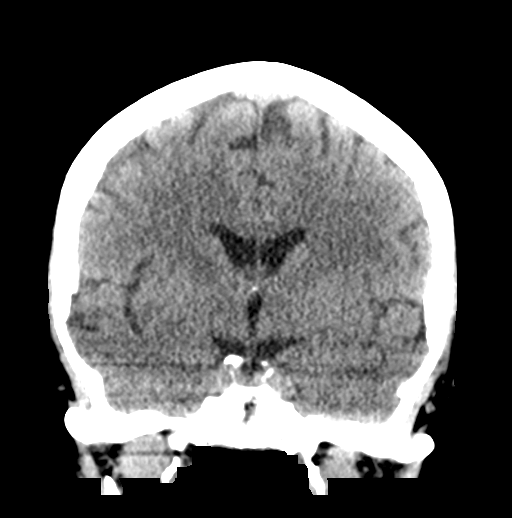

[Series 5: sagittal soft tissue · sagittal · 0.33mm/px · 3 of 60 slices shown]
[im 20/60  brain]
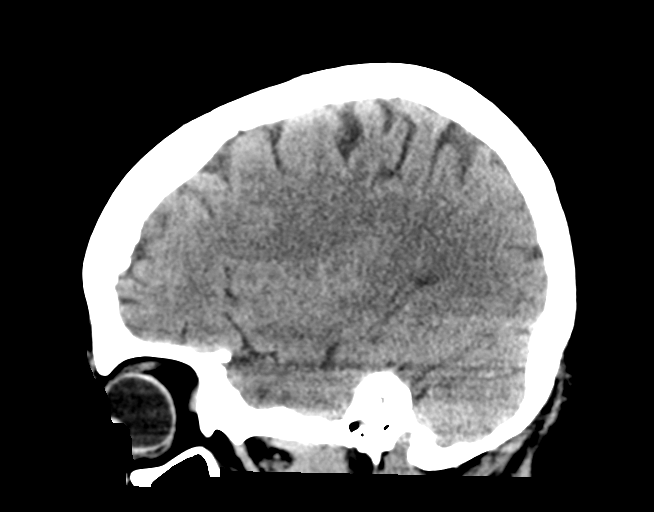
[im 30/60  brain]
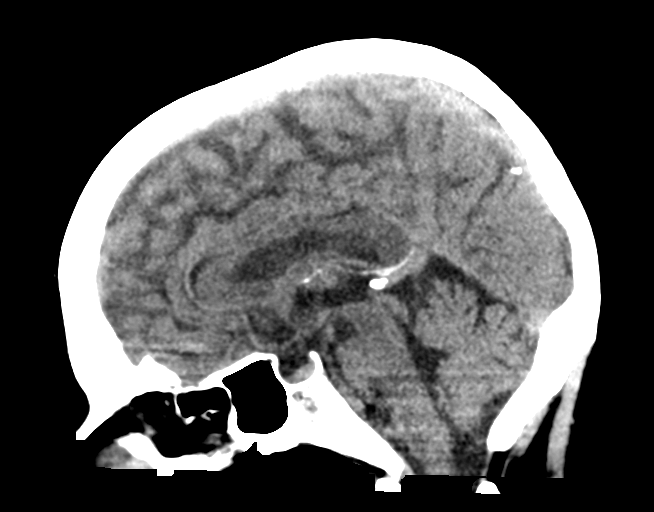
[im 40/60  brain]
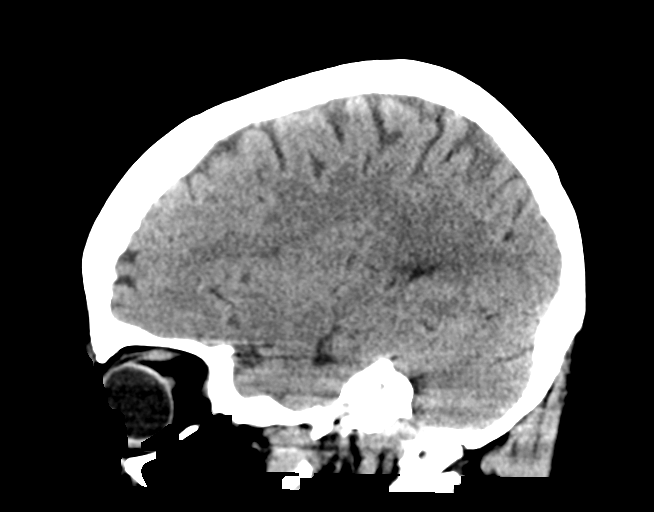

[16 of 47 positions shown; findings below may reference images not displayed]

FINDINGS: Brain: No intracranial hemorrhage, mass effect, or midline shift. No
hydrocephalus. The basilar cisterns are patent. No evidence of
territorial infarct or acute ischemia. No extra-axial or
intracranial fluid collection.

Vascular: No hyperdense vessel.

Skull: No fracture or focal lesion.

Sinuses/Orbits: No acute findings. Frontal sinuses are hypo
pneumatized.

Other: Left frontal scalp laceration.
IMPRESSION: Left frontal scalp laceration. No acute intracranial abnormality. No
skull fracture.

## 2023-02-15 ENCOUNTER — Emergency Department (HOSPITAL_COMMUNITY)
Admission: EM | Admit: 2023-02-15 | Discharge: 2023-02-15 | Disposition: A | Discharge status: Home / Self Care | Payer: Medicare HMO | Attending: Emergency Medicine | Admitting: Emergency Medicine

## 2023-02-15 ENCOUNTER — Emergency Department (HOSPITAL_COMMUNITY): Payer: Medicare HMO

## 2023-02-15 ENCOUNTER — Other Ambulatory Visit: Payer: Self-pay

## 2023-02-15 DIAGNOSIS — Z91040 Latex allergy status: Secondary | ICD-10-CM | POA: Insufficient documentation

## 2023-02-15 DIAGNOSIS — R109 Unspecified abdominal pain: Secondary | ICD-10-CM | POA: Diagnosis present

## 2023-02-15 DIAGNOSIS — N23 Unspecified renal colic: Secondary | ICD-10-CM | POA: Insufficient documentation

## 2023-02-15 DIAGNOSIS — I10 Essential (primary) hypertension: Secondary | ICD-10-CM | POA: Diagnosis not present

## 2023-02-15 LAB — COMPREHENSIVE METABOLIC PANEL
ALT: 35 U/L (ref 0–44)
AST: 30 U/L (ref 15–41)
Albumin: 4 g/dL (ref 3.5–5.0)
Alkaline Phosphatase: 68 U/L (ref 38–126)
Anion gap: 10 (ref 5–15)
BUN: 31 mg/dL — ABNORMAL HIGH (ref 8–23)
CO2: 17 mmol/L — ABNORMAL LOW (ref 22–32)
Calcium: 9.1 mg/dL (ref 8.9–10.3)
Chloride: 113 mmol/L — ABNORMAL HIGH (ref 98–111)
Creatinine, Ser: 1.04 mg/dL — ABNORMAL HIGH (ref 0.44–1.00)
GFR, Estimated: 55 mL/min — ABNORMAL LOW (ref >60)
Glucose, Bld: 104 mg/dL — ABNORMAL HIGH (ref 70–99)
Potassium: 4.8 mmol/L (ref 3.5–5.1)
Sodium: 140 mmol/L (ref 135–145)
Total Bilirubin: 0.8 mg/dL (ref 0.3–1.2)
Total Protein: 7.4 g/dL (ref 6.5–8.1)

## 2023-02-15 LAB — CBC
HCT: 42.3 % (ref 36.0–46.0)
Hemoglobin: 13.7 g/dL (ref 12.0–15.0)
MCH: 30.4 pg (ref 26.0–34.0)
MCHC: 32.4 g/dL (ref 30.0–36.0)
MCV: 93.8 fL (ref 80.0–100.0)
Platelets: 176 10*3/uL (ref 150–400)
RBC: 4.51 MIL/uL (ref 3.87–5.11)
RDW: 12.6 % (ref 11.5–15.5)
WBC: 12.9 10*3/uL — ABNORMAL HIGH (ref 4.0–10.5)
nRBC: 0 % (ref 0.0–0.2)

## 2023-02-15 LAB — URINALYSIS, ROUTINE W REFLEX MICROSCOPIC
Bilirubin Urine: NEGATIVE
Glucose, UA: NEGATIVE mg/dL
Hgb urine dipstick: NEGATIVE
Ketones, ur: NEGATIVE mg/dL
Leukocytes,Ua: NEGATIVE
Nitrite: NEGATIVE
Protein, ur: NEGATIVE mg/dL
Specific Gravity, Urine: 1.018 (ref 1.005–1.030)
pH: 5 (ref 5.0–8.0)

## 2023-02-15 LAB — LIPASE, BLOOD: Lipase: 29 U/L (ref 11–51)

## 2023-02-15 MED ORDER — OXYCODONE HCL 5 MG PO TABS: 5.0000 mg | ORAL_TABLET | Freq: Four times a day (QID) | ORAL | 0 refills | Status: DC | PRN

## 2023-02-15 MED ORDER — IOHEXOL 300 MG/ML  SOLN
100.0000 mL | Freq: Once | INTRAMUSCULAR | Status: AC | PRN
  Administered 2023-02-15: 100 mL via INTRAVENOUS

## 2023-02-15 MED ORDER — TAMSULOSIN HCL 0.4 MG PO CAPS: 0.4000 mg | ORAL_CAPSULE | Freq: Every day | ORAL | 0 refills | Status: DC

## 2023-02-15 NOTE — Discharge Instructions (Addendum)
We evaluated you for your abdominal pain.  Your CT scan shows a small kidney stone on the left side.  This kidney stone will likely pass on its own.  Please take 1000 mg of Tylenol every 6 hours for your pain.  If your pain is unrelieved by Tylenol you can take oxycodone.

## 2023-02-15 NOTE — ED Triage Notes (Signed)
Pov from home cc abdominal pain on left side. Was moving heavy plants and now has pain.

## 2023-02-16 NOTE — ED Provider Notes (Signed)
[facility] AT [facility] Provider Note  CSN: [id number] Arrival date & time: 02/15/23 1844  Chief Complaint(s) Abdominal Pain  HPI Laura Buchanan is a 77 y.o. female with history of hypertension hyperlipidemia presenting to the emergency department with left-sided abdominal pain.  Occurred suddenly.  No fevers or chills.  No dysuria.  No chest pain or shortness of breath.  Reports nausea and vomiting which is improved.  Denies similar episode in the past.  She is worried this could be related to moving some plants around her house.   Past Medical History Past Medical History:  Diagnosis Date   Hyperlipidemia    Hypertension    There are no problems to display for this patient.  Home Medication(s) Prior to Admission medications   Medication Sig Start Date End Date Taking? Authorizing Provider  oxyCODONE (ROXICODONE) 5 MG immediate release tablet Take 1 tablet (5 mg total) by mouth every 6 (six) hours as needed for severe pain. 02/15/23  Yes Lonell Grandchild, MD  tamsulosin (FLOMAX) 0.4 MG CAPS capsule Take 1 capsule (0.4 mg total) by mouth daily after supper. 02/15/23  Yes Lonell Grandchild, MD  ALPRAZolam [name] 0.25 MG tablet Take 0.25 mg by mouth 3 (three) times daily as needed for anxiety.    [provider]  amLODipine (NORVASC) 2.5 MG tablet Take 2.5 mg by mouth daily.    [provider]  EPINEPHrine (EPIPEN) 0.3 mg/0.3 mL SOAJ injection Inject 0.3 mg into the muscle once.    [provider]  hydrocortisone 2.5 % cream Apply 1 application topically 3 (three) times daily.    [provider]  latanoprost (XALATAN) 0.005 % ophthalmic solution Place 1 drop into both eyes at bedtime.    [provider]  levocetirizine (XYZAL) 5 MG tablet Take 5 mg by mouth every evening.    [provider]  magnesium oxide (MAG-OX) 400 MG tablet Take 400 mg by mouth daily.    [provider]  omeprazole  (PRILOSEC) 20 MG capsule Take 20 mg by mouth 2 (two) times daily before a meal.    [provider]  ondansetron (ZOFRAN ODT) 4 MG disintegrating tablet Take 1 tablet (4 mg total) by mouth every 8 (eight) hours as needed for nausea or vomiting. 11/02/13   Melene Plan, DO  rosuvastatin (CRESTOR) 10 MG tablet Take 10 mg by mouth daily.    [provider]  terbinafine (LAMISIL) 250 MG tablet Take 250 mg by mouth daily.    [provider]  tolterodine (DETROL) 2 MG tablet Take 2 mg by mouth at bedtime.    [provider]                                                                                                                                    Past Surgical History No past surgical history on file. Family History No family history  on file.  Social History Social History   Tobacco Use   Smoking status: Never   Smokeless tobacco: Never  Substance Use Topics   Alcohol use: No   Drug use: Never   Allergies Bee venom and Latex  Review of Systems Review of Systems  All other systems reviewed and are negative.   Physical Exam Vital Signs  I have reviewed the triage vital signs BP (!) 155/95   Pulse 98   Temp 98.4 F (36.9 C) (Oral)   Resp (!) 24   Ht 5' (1.524 m)   Wt 83.5 kg   SpO2 97%   BMI 35.94 kg/m  Physical Exam Vitals and nursing note reviewed.  Constitutional:      General: She is not in acute distress.    Appearance: She is well-developed.  HENT:     Head: Normocephalic and atraumatic.     Mouth/Throat:     Mouth: Mucous membranes are moist.  Eyes:     Pupils: Pupils are equal, round, and reactive to light.  Cardiovascular:     Rate and Rhythm: Normal rate and regular rhythm.     Heart sounds: No murmur heard. Pulmonary:     Effort: Pulmonary effort is normal. No respiratory distress.     Breath sounds: Normal breath sounds.  Abdominal:     General: Abdomen is flat.     Palpations: Abdomen is soft.     Tenderness: There  is abdominal tenderness in the left lower quadrant. There is no right CVA tenderness or left CVA tenderness.  Musculoskeletal:        General: No tenderness.     Right lower leg: No edema.     Left lower leg: No edema.  Skin:    General: Skin is warm and dry.  Neurological:     General: No focal deficit present.     Mental Status: She is alert. Mental status is at baseline.  Psychiatric:        Mood and Affect: Mood normal.        Behavior: Behavior normal.     ED Results and Treatments Labs (all labs ordered are listed, but only abnormal results are displayed) Labs Reviewed  COMPREHENSIVE METABOLIC PANEL - Abnormal; Notable for the following components:      Result Value   Chloride 113 (*)    CO2 17 (*)    Glucose, Bld 104 (*)    BUN 31 (*)    Creatinine, Ser 1.04 (*)    GFR, Estimated 55 (*)    All other components within normal limits  URINALYSIS, ROUTINE W REFLEX MICROSCOPIC - Abnormal; Notable for the following components:   APPearance HAZY (*)    All other components within normal limits  CBC - Abnormal; Notable for the following components:   WBC 12.9 (*)    All other components within normal limits  LIPASE, BLOOD  Radiology CT ABDOMEN PELVIS W CONTRAST  Result Date: 02/15/2023 CLINICAL DATA:  Left lower quadrant abdominal pain EXAM: CT ABDOMEN AND PELVIS WITH CONTRAST TECHNIQUE: Multidetector CT imaging of the abdomen and pelvis was performed using the standard protocol following bolus administration of intravenous contrast. RADIATION DOSE REDUCTION: This exam was performed according to the departmental dose-optimization program which includes automated exposure control, adjustment of the mA and/or kV according to patient size and/or use of iterative reconstruction technique. CONTRAST:  OMNIPAQUE IOHEXOL 300 MG/ML  SOLN COMPARISON:  04/18/2009  FINDINGS: Lower chest: No acute pleural or parenchymal lung disease. Hepatobiliary: Calcified gallstone without cholecystitis. The liver is unremarkable. Pancreas: Unremarkable. No pancreatic ductal dilatation or surrounding inflammatory changes. Spleen: Normal in size without focal abnormality. Adrenals/Urinary Tract: There is a 3 mm calculus at the left UVJ, reference image 73/2, with mild left-sided obstructive uropathy. Mild left perinephric fat stranding and left renal edema. There are other punctate less than 3 mm nonobstructing bilateral renal calculi. The adrenals and bladder are unremarkable. Stomach/Bowel: No bowel obstruction or ileus. Diverticulosis within the sigmoid colon without evidence of diverticulitis. The appendix is not identified and is likely surgically absent. No bowel wall thickening or inflammatory change. Small hiatal hernia. Vascular/Lymphatic: Aortic atherosclerosis. No enlarged abdominal or pelvic lymph nodes. Reproductive: Uterus and bilateral adnexa are unremarkable. Other: No free fluid or free intraperitoneal gas. Postsurgical changes from prior infraumbilical ventral hernia repair. No abdominal wall hernia. Musculoskeletal: No acute or destructive bony abnormalities. Reconstructed images demonstrate no additional findings. IMPRESSION: 1. Obstructing 3 mm left UVJ calculus. There is mild left-sided hydronephrosis, with left renal edema and perinephric fat stranding. 2. Other punctate less than 3 mm nonobstructing bilateral renal calculi. 3. Cholelithiasis without cholecystitis. 4.  Aortic Atherosclerosis (ICD10-I70.0). 5. Small hiatal hernia. Electronically Signed   By: Sharlet Salina M.D.   On: 02/15/2023 22:55    Pertinent labs & imaging results that were available during my care of the patient were reviewed by me and considered in my medical decision making (see MDM for details).  Medications Ordered in ED Medications  iohexol (OMNIPAQUE) 300 MG/ML solution 100 mL (100 mLs  Intravenous Contrast Given 02/15/23 2233)                                                                                                                                     Procedures Procedures  (including critical care time)  Medical Decision Making / ED Course   MDM:  77 year old female presenting to the emergency department left-sided abdominal pain.  CT scan shows 3 mm obstructing renal stone likely the cause of the patient's symptoms.  She denies any dysuria and has no fevers or chills to suggest underlying infectious process.  Urinalysis not concerning for underlying infectious process.  CT abdomen without evidence of other acute intra-abdominal process such as diverticulitis, perforation colitis.  Symptoms improved in the emergency department and patient did not  require any pain medication.  Will prescribe oxycodone.  Reviewed PDMP.  Discussed avoiding driving or operating machinery or drinking alcohol.  Also prescribed Flomax.  Advised follow-up with urology. Will discharge patient to home. All questions answered. Patient comfortable with plan of discharge. Return precautions discussed with patient and specified on the after visit summary.       Additional history obtained: -Additional history obtained from family    Lab Tests: -I ordered, reviewed, and interpreted labs.   The pertinent results include:   Labs Reviewed  COMPREHENSIVE METABOLIC PANEL - Abnormal; Notable for the following components:      Result Value   Chloride 113 (*)    CO2 17 (*)    Glucose, Bld 104 (*)    BUN 31 (*)    Creatinine, Ser 1.04 (*)    GFR, Estimated 55 (*)    All other components within normal limits  URINALYSIS, ROUTINE W REFLEX MICROSCOPIC - Abnormal; Notable for the following components:   APPearance HAZY (*)    All other components within normal limits  CBC - Abnormal; Notable for the following components:   WBC 12.9 (*)    All other components within normal limits  LIPASE,  BLOOD    Notable for mild dehydration  EKG   EKG Interpretation  Date/Time:  Monday Feb 15 2023 21:36:56 EDT Ventricular Rate:  87 PR Interval:  206 QRS Duration: 89 QT Interval:  388 QTC Calculation: 467 R Axis:   -43 Text Interpretation: Sinus rhythm with prolonged PR interval Left anterior fascicular block Baseline wander in lead(s) I III aVL Confirmed by Vonita Moss [phone]) on 02/16/2023 11:02:21 AM         Imaging Studies ordered: I ordered imaging studies including CT A/P On my interpretation imaging demonstrates no acute process I independently visualized and interpreted imaging. I agree with the radiologist interpretation   Medicines ordered and prescription drug management: Meds ordered this encounter  Medications   iohexol (OMNIPAQUE) 300 MG/ML solution 100 mL   oxyCODONE (ROXICODONE) 5 MG immediate release tablet    Sig: Take 1 tablet (5 mg total) by mouth every 6 (six) hours as needed for severe pain.    Dispense:  12 tablet    Refill:  0   tamsulosin (FLOMAX) 0.4 MG CAPS capsule    Sig: Take 1 capsule (0.4 mg total) by mouth daily after supper.    Dispense:  30 capsule    Refill:  0    -I have reviewed the patients home medicines and have made adjustments as needed   Social Determinants of Health:  Diagnosis or treatment significantly limited by social determinants of health: obesity   Reevaluation: After the interventions noted above, I reevaluated the patient and found that their symptoms have improved  Co morbidities that complicate the patient evaluation  Past Medical History:  Diagnosis Date   Hyperlipidemia    Hypertension       Dispostion: Disposition decision including need for hospitalization was considered, and patient discharged from emergency department.    Final Clinical Impression(s) / ED Diagnoses Final diagnoses:  Renal colic on left side     This chart was dictated using voice recognition software.  Despite best  efforts to proofread,  errors can occur which can change the documentation meaning.    Lonell Grandchild, MD 02/16/23 [phone]

## 2023-02-23 ENCOUNTER — Other Ambulatory Visit: Payer: Self-pay

## 2023-02-23 DIAGNOSIS — N200 Calculus of kidney: Secondary | ICD-10-CM

## 2023-02-24 ENCOUNTER — Ambulatory Visit: Payer: Medicare HMO | Admitting: Urology

## 2023-02-24 ENCOUNTER — Ambulatory Visit (HOSPITAL_COMMUNITY)
Admission: RE | Admit: 2023-02-24 | Discharge: 2023-02-24 | Disposition: A | Discharge status: Home / Self Care | Payer: Medicare HMO | Source: Ambulatory Visit | Attending: Urology | Admitting: Urology

## 2023-02-24 ENCOUNTER — Encounter: Payer: Self-pay | Admitting: Urology

## 2023-02-24 VITALS — BP 144/83 | HR 77

## 2023-02-24 DIAGNOSIS — N200 Calculus of kidney: Secondary | ICD-10-CM | POA: Insufficient documentation

## 2023-02-24 MED ORDER — TAMSULOSIN HCL 0.4 MG PO CAPS: 0.4000 mg | ORAL_CAPSULE | Freq: Every day | ORAL | 0 refills | Status: DC

## 2023-02-24 MED ORDER — ONDANSETRON 4 MG PO TBDP: 4.0000 mg | ORAL_TABLET | Freq: Three times a day (TID) | ORAL | 0 refills | Status: DC | PRN

## 2023-02-24 MED ORDER — OXYCODONE HCL 5 MG PO TABS: 5.0000 mg | ORAL_TABLET | Freq: Four times a day (QID) | ORAL | 0 refills | Status: DC | PRN

## 2023-02-24 NOTE — Progress Notes (Unsigned)
02/24/2023 11:24 AM   Laura Buchanan [birth year] [id number]  Referring provider: Barbie Banner, MD [address] [place],  [place] [address]  nephrolithiasis  HPI: [name] is a 77yo here for evaluation of nephrolithiasis. On 5/20 she developed left flank pain and presented to the ER. She was diagosed with a 3mm left UVJ calculus. She does not know if she passed her stone. He last pain episode was 3 days ago. CT shows bilateral 1-18mm renal calculi.   PMH: Past Medical History:  Diagnosis Date   Hyperlipidemia    Hypertension     Surgical History: No past surgical history on file.  Home Medications:  Allergies as of 02/24/2023       Reactions   Bee Venom    Latex Rash        Medication List        Accurate as of Feb 24, 2023 11:24 AM. If you have any questions, ask your nurse or doctor.          STOP taking these medications    ketorolac 0.5 % ophthalmic solution Commonly known as: ACULAR Stopped by: Wilkie Aye, MD   latanoprost 0.005 % ophthalmic solution Commonly known as: XALATAN Stopped by: Wilkie Aye, MD   tolterodine 2 MG tablet Commonly known as: DETROL Stopped by: Wilkie Aye, MD       TAKE these medications    acetaZOLAMIDE 250 MG tablet Commonly known as: DIAMOX Take 250 mg by mouth 2 (two) times daily.   ALPRAZolam 0.25 MG tablet Commonly known as: XANAX Take 0.25 mg by mouth 3 (three) times daily as needed for anxiety.   amLODipine 2.5 MG tablet Commonly known as: NORVASC Take 2.5 mg by mouth daily.   aspirin 81 MG chewable tablet Chew by mouth.   calcium acetate 667 MG capsule Commonly known as: PHOSLO Take by mouth.   EpiPen 0.3 mg/0.3 mL Soaj injection Generic drug: EPINEPHrine Inject 0.3 mg into the muscle once.   glucosamine-chondroitin 500-400 MG tablet Take by mouth.   hydrocortisone 2.5 % cream Apply 1 application topically 3 (three) times daily.   levocetirizine 5 MG tablet Commonly  known as: XYZAL Take 5 mg by mouth every evening.   magnesium oxide 400 MG tablet Commonly known as: MAG-OX Take 400 mg by mouth daily.   multivitamin capsule Take 1 capsule by mouth daily.   omeprazole 20 MG capsule Commonly known as: PRILOSEC Take 20 mg by mouth 2 (two) times daily before a meal.   ondansetron 4 MG disintegrating tablet Commonly known as: Zofran ODT Take 1 tablet (4 mg total) by mouth every 8 (eight) hours as needed for nausea or vomiting.   oxyCODONE 5 MG immediate release tablet Commonly known as: Roxicodone Take 1 tablet (5 mg total) by mouth every 6 (six) hours as needed for severe pain.   PSYLLIUM PO   Restasis 0.05 % ophthalmic emulsion Generic drug: cycloSPORINE 1 drop 2 (two) times daily.   rosuvastatin 10 MG tablet Commonly known as: CRESTOR Take 10 mg by mouth daily.   solifenacin 10 MG tablet Commonly known as: VESICARE TAKE 1 TABLET DAILY FOR SPASTIC BLADDER.   tamsulosin 0.4 MG Caps capsule Commonly known as: FLOMAX Take 1 capsule (0.4 mg total) by mouth daily after supper.   terbinafine 250 MG tablet Commonly known as: LAMISIL Take 250 mg by mouth daily.        Allergies:  Allergies  Allergen Reactions   Bee Venom  Latex Rash    Family History: No family history on file.  Social History:  reports that she has never smoked. She has never used smokeless tobacco. She reports that she does not drink alcohol and does not use drugs.  ROS: All other review of systems were reviewed and are negative except what is noted above in HPI  Physical Exam: BP (!) 144/83   Pulse 77   Constitutional:  Alert and oriented, No acute distress. HEENT: [place] AT, moist mucus membranes.  Trachea midline, no masses. Cardiovascular: No clubbing, cyanosis, or edema. Respiratory: Normal respiratory effort, no increased work of breathing. GI: Abdomen is soft, nontender, nondistended, no abdominal masses GU: No CVA tenderness.  Lymph: No cervical  or inguinal lymphadenopathy. Skin: No rashes, bruises or suspicious lesions. Neurologic: Grossly intact, no focal deficits, moving all 4 extremities. Psychiatric: Normal mood and affect.  Laboratory Data: Lab Results  Component Value Date   WBC 12.9 (H) 02/15/2023   HGB 13.7 02/15/2023   HCT 42.3 02/15/2023   MCV 93.8 02/15/2023   PLT 176 02/15/2023    Lab Results  Component Value Date   CREATININE 1.04 (H) 02/15/2023    No results found for: "PSA"  No results found for: "TESTOSTERONE"  No results found for: "HGBA1C"  Urinalysis    Component Value Date/Time   COLORURINE YELLOW 02/15/2023 2200   APPEARANCEUR HAZY (A) 02/15/2023 2200   LABSPEC 1.018 02/15/2023 2200   PHURINE 5.0 02/15/2023 2200   GLUCOSEU NEGATIVE 02/15/2023 2200   HGBUR NEGATIVE 02/15/2023 2200   BILIRUBINUR NEGATIVE 02/15/2023 2200   KETONESUR NEGATIVE 02/15/2023 2200   PROTEINUR NEGATIVE 02/15/2023 2200   NITRITE NEGATIVE 02/15/2023 2200   LEUKOCYTESUR NEGATIVE 02/15/2023 2200    No results found for: "LABMICR", "WBCUA", "RBCUA", "LABEPIT", "MUCUS", "BACTERIA"  Pertinent Imaging: CT 02/15/2023 and [facility] today: Images reviewed and discussed with the patient No results found for this or any previous visit.  No results found for this or any previous visit.  No results found for this or any previous visit.  No results found for this or any previous visit.  No results found for this or any previous visit.  No valid procedures specified. No results found for this or any previous visit.  No results found for this or any previous visit.   Assessment & Plan:    1. Kidney stones -We discussed the management of kidney stones. These options include observation, ureteroscopy, shockwave lithotripsy (ESWL) and percutaneous nephrolithotomy (PCNL). We discussed which options are relevant to the patient's stone(s). We discussed the natural history of kidney stones as well as the complications of untreated  stones and the impact on quality of life without treatment as well as with each of the above listed treatments. We also discussed the efficacy of each treatment in its ability to clear the stone burden. With any of these management options I discussed the signs and symptoms of infection and the need for emergent treatment should these be experienced. For each option we discussed the ability of each procedure to clear the patient of their stone burden.   For observation I described the risks which include but are not limited to silent renal damage, life-threatening infection, need for emergent surgery, failure to pass stone and pain.   For ureteroscopy I described the risks which include bleeding, infection, damage to contiguous structures, positioning injury, ureteral stricture, ureteral avulsion, ureteral injury, need for prolonged ureteral stent, inability to perform ureteroscopy, need for an interval procedure, inability to clear  stone burden, stent discomfort/pain, heart attack, stroke, pulmonary embolus and the inherent risks with general anesthesia.   For shockwave lithotripsy I described the risks which include arrhythmia, kidney contusion, kidney hemorrhage, need for transfusion, pain, inability to adequately break up stone, inability to pass stone fragments, Steinstrasse, infection associated with obstructing stones, need for alternate surgical procedure, need for repeat shockwave lithotripsy, MI, CVA, PE and the inherent risks with anesthesia/conscious sedation.   For PCNL I described the risks including positioning injury, pneumothorax, hydrothorax, need for chest tube, inability to clear stone burden, renal laceration, arterial venous fistula or malformation, need for embolization of kidney, loss of kidney or renal function, need for repeat procedure, need for prolonged nephrostomy tube, ureteral avulsion, MI, CVA, PE and the inherent risks of general anesthesia.   - The patient would like to  proceed with medical expulsive therapy.   No follow-ups on file.  Wilkie Aye, MD  [facility] [place]

## 2023-02-24 NOTE — Patient Instructions (Signed)

## 2023-02-25 ENCOUNTER — Encounter: Payer: Self-pay | Admitting: Urology

## 2023-03-10 NOTE — Progress Notes (Signed)
History of Present Illness: Laura Buchanan is a 77 y.o. female who presents today for follow up visit at [facility] [place]. She is accompanied by her daughter Aggie Cosier. - GU History: 1. Kidney stones.  Relevant imaging: - 02/15/2023: CT abdomen/pelvis w/ contrast showed: 1. Obstructing 3 mm left UVJ calculus. There is mild left-sided hydronephrosis, with left renal edema and perinephric fat stranding. 2. Other punctate less than 3 mm nonobstructing bilateral renal calculi.  At last visit with Dr. Ronne Binning on 02/24/2023: The plan was medical expulsive therapy.  Today: She reports she thinks she passed the left ureteral stone. She denies acute flank pain / abdominal pain. She denies fevers.  She denies dysuria or gross hematuria, straining to void, or sensations of incomplete emptying.  She reports history of OAB with urinary frequency, urgency, and urge incontinence. Wears 2-3 pads per day. She is taking Vesicare as prescribed by her PCP; states her symptoms are manageable with that medication.     Fall Screening: Do you usually have a device to assist in your mobility? No   Medications: Current Outpatient Medications  Medication Sig Dispense Refill   acetaZOLAMIDE (DIAMOX) 250 MG tablet Take 250 mg by mouth 2 (two) times daily.     ALPRAZolam (XANAX) 0.25 MG tablet Take 0.25 mg by mouth 3 (three) times daily as needed for anxiety.     amLODipine (NORVASC) 2.5 MG tablet Take 2.5 mg by mouth daily.     aspirin 81 MG chewable tablet Chew by mouth.     calcium acetate (PHOSLO) 667 MG capsule Take by mouth.     EPINEPHrine (EPIPEN) 0.3 mg/0.3 mL SOAJ injection Inject 0.3 mg into the muscle once.     glucosamine-chondroitin 500-400 MG tablet Take by mouth.     hydrocortisone 2.5 % cream Apply 1 application topically 3 (three) times daily.     ketorolac (ACULAR) 0.5 % ophthalmic solution SMARTSIG:In Eye(s)     levocetirizine (XYZAL) 5 MG tablet Take 5 mg by mouth every  evening.     magnesium oxide (MAG-OX) 400 MG tablet Take 400 mg by mouth daily.     Multiple Vitamin (MULTIVITAMIN) capsule Take 1 capsule by mouth daily.     omeprazole (PRILOSEC) 20 MG capsule Take 20 mg by mouth 2 (two) times daily before a meal.     ondansetron (ZOFRAN ODT) 4 MG disintegrating tablet Take 1 tablet (4 mg total) by mouth every 8 (eight) hours as needed for nausea or vomiting. 20 tablet 0   oxyCODONE (ROXICODONE) 5 MG immediate release tablet Take 1 tablet (5 mg total) by mouth every 6 (six) hours as needed for severe pain. 30 tablet 0   prednisoLONE acetate (PRED FORTE) 1 % ophthalmic suspension SMARTSIG:In Eye(s)     PSYLLIUM PO      RESTASIS 0.05 % ophthalmic emulsion 1 drop 2 (two) times daily.     rosuvastatin (CRESTOR) 10 MG tablet Take 10 mg by mouth daily.     solifenacin (VESICARE) 10 MG tablet TAKE 1 TABLET DAILY FOR SPASTIC BLADDER.     tamsulosin (FLOMAX) 0.4 MG CAPS capsule Take 1 capsule (0.4 mg total) by mouth daily after supper. 30 capsule 0   terbinafine (LAMISIL) 250 MG tablet Take 250 mg by mouth daily.     timolol (TIMOPTIC) 0.5 % ophthalmic solution 1 drop every morning.     No current facility-administered medications for this visit.    Allergies: Allergies  Allergen Reactions   Bee Venom  Latex Rash    Past Medical History:  Diagnosis Date   Hyperlipidemia    Hypertension    History reviewed. No pertinent surgical history. History reviewed. No pertinent family history. Social History   Socioeconomic History   Marital status: Widowed    Spouse name: Not on file   Number of children: Not on file   Years of education: Not on file   Highest education level: Not on file  Occupational History   Not on file  Tobacco Use   Smoking status: Never   Smokeless tobacco: Never  Substance and Sexual Activity   Alcohol use: Yes    Comment: occasionally   Drug use: Never   Sexual activity: Never  Other Topics Concern   Not on file  Social  History Narrative   Not on file   Social Determinants of Health   Financial Resource Strain: Not on file  Food Insecurity: Not on file  Transportation Needs: Not on file  Physical Activity: Not on file  Stress: Not on file  Social Connections: Not on file  Intimate Partner Violence: Not on file    SUBJECTIVE  Review of Systems Constitutional: Patient denies any unintentional weight loss or change in strength lntegumentary: Patient denies any rashes or pruritus Eyes: Patient denies dry eyes ENT: Patient denies dry mouth Cardiovascular: Patient denies chest pain or syncope Respiratory: Patient denies shortness of breath Gastrointestinal: Patient denies nausea, vomiting, constipation, or diarrhea Musculoskeletal: Patient reports bilateral lower back  Neurologic: Patient denies convulsions or seizures Psychiatric: Patient denies memory problems Allergic/Immunologic: Patient denies recent allergic reaction(s) Hematologic/Lymphatic: Patient denies bleeding tendencies Endocrine: Patient denies heat/cold intolerance  GU: As per HPI.  OBJECTIVE There were no vitals filed for this visit. There is no height or weight on file to calculate BMI.  Physical Examination  Constitutional: No obvious distress; patient is non-toxic appearing  Cardiovascular: No visible lower extremity edema.  Respiratory: The patient does not have audible wheezing/stridor; respirations do not appear labored  Gastrointestinal: Abdomen non-distended Musculoskeletal: Normal ROM of UEs  Skin: No obvious rashes/open sores  Neurologic: CN 2-12 grossly intact Psychiatric: Answered questions appropriately with normal affect  Hematologic/Lymphatic/Immunologic: No obvious bruises or sites of spontaneous bleeding  UA: 6-10 WBC/hpf, crystals present, bacteria (few)   ASSESSMENT Left ureteral stone - Plan: Urinalysis, Routine w reflex microscopic  Kidney stones - Plan: DG Abd 1 View  Urinary urgency  Urge  incontinence  Urinary frequency  OAB (overactive bladder)  She is doing well today with no acute concerns. Advised patient to obtain KUB within 1 week to confirm passage of left ureteral stone.   For stone prevention: Advised adequate hydration and we discussed option to consider low oxalate diet given that calcium oxalate is the most common type of stone. Handout provided about stone prevention diet.  OK to continue Vesicare as prescribed elsewhere for OAB with urinary frequency, urgency, and urge incontinence.   Will plan to follow up in 6 months with KUB for stone surveillance or sooner if needed. Pt verbalized understanding and agreement. All questions were answered.  PLAN Advised the following: KUB within 1 week. Maintain adequate fluid intake. Low oxalate diet. Return in about 6 months (around 09/11/2023) for KUB, UA, & f/u with Evette Georges NP.  Orders Placed This Encounter  Procedures   Microscopic Examination   DG Abd 1 View    Standing Status:   Future    Standing Expiration Date:   03/11/2024    Order Specific Question:  Reason for Exam (SYMPTOM  OR DIAGNOSIS REQUIRED)    Answer:   kidney stone    Order Specific Question:   Preferred imaging location?    Answer:   [facility]   Urinalysis, Routine w reflex microscopic    It has been explained that the patient is to follow regularly with their PCP in addition to all other providers involved in their care and to follow instructions provided by these respective offices. Patient advised to contact urology clinic if any urologic-pertaining questions, concerns, new symptoms or problems arise in the interim period.  Patient Instructions  >80% of stones are calcium oxalate. This type of stones forms when body either isn't clearing oxalate well enough, is making too much oxalate, or too little citrate. This results in oxalate binding to form crystals, which continue to aggregate and form stones.  Limiting calcium  does not help, but limiting oxalate in the diet can help. Increasing citric acid intake may also help.  The following measures may help to prevent the recurrence of stones: Increase water intake to 2-2.5 liters per day May add citrus juice (lemon, lime or orange juice) to water Moderation in dairy foods Decrease in salt content Low Oxalate diet: Oxylates are found in foods like Tomato, Spinach, red wine and chocolate (see additional resources below).  Internet resources for information regarding low oxalate diet:  https://kidneystones.yangchunwu.com https://my.VerticalStretch.be  Foods Low in Sodium or Oxalate Foods You Can Eat  Drinks Coffee, fruit and veggie juice (using the recommended veggies), fruit punch  Fruits Apples, apricots (fresh or canned), avocado, bananas, cherries (sweet), cranberries, grapefruit, red or green grapes, lemon and lime juice, melons, nectarines, papayas, peaches, pears, pineapples, oranges, strawberries (fresh), tangerines  Veggies Artichokes, asparagus, bamboo shoots, broccoli, brussels sprouts, cabbage, cauliflower, chayote squash, chicory, corn, cucumbers, endive, lettuce, lima beans, mushrooms, onions, peas, peppers, potatoes, radishes, rutabagas, zucchini  Breads, Cereals, Grains Egg noodles, rye bread, cooked and dry cereals without nuts or bran, crackers with unsalted tops, white or wild rice  Meat, Meat Replacements, Fish, Recruitment consultant, fish, poultry, eggs, egg whites, egg replacements  Soup Homemade soup (using the recommended veggies and meat), low-sodium bouillon, low-sodium canned  Desserts Cookies, cakes, ice cream, pudding without chocolate or nuts, candy without chocolate or nuts  Fats and Oils Butter, margarine, cream, oil, salad dressing, mayo  Other Foods Unsalted potato chips or pretzels, herbs (like garlic, garlic powder, onion powder), lemon juice,  salt-free seasoning blends, vinegar  Other Foods Low in Oxalate Foods You Can Eat  Drinks Beer, cola, wine, buttermilk, lemonade or limeade (without added vitamin C), milk  Meat, Meat Replacements, Fish, [employer] meat, ham, bacon, hot dogs, bratwurst, sausage, chicken nuggets, cheddar cheese, canned fish and shellfish  Soup Tomato soup, cheese soup  Other Foods Coconuts, lemon or lime juices, sugar or sweeteners, jellies or jams (from the recommended list)   Moderate-Oxalate Foods Foods to Limit   Drinks Fruit and veggie juices (from the list below), chocolate milk, rice milk, hot cocoa, tea   Fruits Blackberries, blueberries, black currants, cherries (sour), fruit cocktail, mangoes, orange peel, prunes, purple plums   Veggies Baked beans, carrots, celery, green beans, parsnips, summer squash, tomatoes, turnips   Breads, Cereals, Grains White bread, cornbread or cornmeal, white English muffins, saltine or soda crackers, brown rice, vanilla wafers, spaghetti and other noodles, firm tofu, bagels, oatmeal   Meat/meat replacements, fish, poultry Sardines   Desserts Chocolate cake   Fats and Oils Macadamia nuts, pistachio nuts, English walnuts  Other Foods Jams or jellies (made with the fruits above), pepper   High-Oxalate Foods Foods to Avoid Drinks Chocolate drink mixes, soy milk, Ovaltine, instant iced tea, fruit juices of fruits listed below Fruits Apricots (dried), red currants, figs, kiwi, plums, rhubarb Veggies Beans (wax, dried), beets and beet greens, chives, collard greens, eggplant, escarole, dark greens of all kinds, leeks, okra, parsley, rutabagas, spinach, Swiss chard, tomato paste, watercress Breads, Cereals, Grains Amaranth, barley, white corn flour, fried potatoes, fruitcake, grits, soybean products, sweet potatoes, wheat germ and bran, buckwheat flour, All Bran cereal, graham crackers, pretzels, whole wheat bread Meat/meat replacements, fish, poultry Dried beans, peanut  butter, soy burgers, miso Desserts Carob, chocolate, marmalades Fats and Oils Nuts (peanuts, almonds, pecans, cashews, hazelnuts), nut butters, sesame seeds, tahini paste Other Foods Poppy seeds   Electronically signed by:  Donnita Falls, MSN, FNP-C, CUNP 03/12/2023 1:49 PM

## 2023-03-12 ENCOUNTER — Ambulatory Visit: Payer: Medicare HMO | Admitting: Urology

## 2023-03-12 ENCOUNTER — Encounter: Payer: Self-pay | Admitting: Urology

## 2023-03-12 DIAGNOSIS — N3941 Urge incontinence: Secondary | ICD-10-CM

## 2023-03-12 DIAGNOSIS — N201 Calculus of ureter: Secondary | ICD-10-CM

## 2023-03-12 DIAGNOSIS — N3281 Overactive bladder: Secondary | ICD-10-CM | POA: Diagnosis not present

## 2023-03-12 DIAGNOSIS — N200 Calculus of kidney: Secondary | ICD-10-CM | POA: Insufficient documentation

## 2023-03-12 DIAGNOSIS — R3915 Urgency of urination: Secondary | ICD-10-CM | POA: Insufficient documentation

## 2023-03-12 DIAGNOSIS — R350 Frequency of micturition: Secondary | ICD-10-CM | POA: Insufficient documentation

## 2023-03-12 LAB — URINALYSIS, ROUTINE W REFLEX MICROSCOPIC
Bilirubin, UA: NEGATIVE
Glucose, UA: NEGATIVE
Ketones, UA: NEGATIVE
Nitrite, UA: NEGATIVE
Protein,UA: NEGATIVE
RBC, UA: NEGATIVE
Specific Gravity, UA: 1.01 (ref 1.005–1.030)
Urobilinogen, Ur: 0.2 mg/dL (ref 0.2–1.0)
pH, UA: 7.5 (ref 5.0–7.5)

## 2023-03-12 LAB — MICROSCOPIC EXAMINATION

## 2023-03-12 NOTE — Patient Instructions (Signed)

## 2023-03-18 ENCOUNTER — Ambulatory Visit (HOSPITAL_COMMUNITY)
Admission: RE | Admit: 2023-03-18 | Discharge: 2023-03-18 | Disposition: A | Discharge status: Home / Self Care | Payer: Medicare HMO | Source: Ambulatory Visit | Attending: Urology | Admitting: Urology

## 2023-03-18 DIAGNOSIS — N3281 Overactive bladder: Secondary | ICD-10-CM | POA: Diagnosis present

## 2023-03-18 DIAGNOSIS — N3941 Urge incontinence: Secondary | ICD-10-CM | POA: Insufficient documentation

## 2023-03-18 DIAGNOSIS — R3915 Urgency of urination: Secondary | ICD-10-CM | POA: Diagnosis present

## 2023-03-18 DIAGNOSIS — N201 Calculus of ureter: Secondary | ICD-10-CM | POA: Diagnosis present

## 2023-03-18 DIAGNOSIS — N200 Calculus of kidney: Secondary | ICD-10-CM | POA: Insufficient documentation

## 2023-03-18 DIAGNOSIS — R350 Frequency of micturition: Secondary | ICD-10-CM | POA: Diagnosis present

## 2023-03-23 NOTE — Progress Notes (Signed)
Please let pt know KUB confirmed stone passage. F/u in 6 months as planned.

## 2023-03-24 ENCOUNTER — Telehealth: Payer: Self-pay

## 2023-03-24 NOTE — Telephone Encounter (Signed)
-----   Message from Troy Sine, [place] sent at 03/23/2023  4:32 PM EDT -----  ----- Message ----- From: Donnita Falls, FNP Sent: 03/23/2023   3:09 PM EDT To: [facility]  Please let pt know KUB confirmed stone passage. F/u in 6 months as planned.

## 2023-03-24 NOTE — Telephone Encounter (Signed)
I contacted patient and spoke with her about KUB results. Patient understood results. She is aware of 12/16 appointment with Evette Georges FNP and prior KUB on 09/11/23, as well.

## 2023-03-29 ENCOUNTER — Encounter (INDEPENDENT_AMBULATORY_CARE_PROVIDER_SITE_OTHER): Payer: Medicare HMO | Admitting: Ophthalmology

## 2023-03-29 DIAGNOSIS — H59031 Cystoid macular edema following cataract surgery, right eye: Secondary | ICD-10-CM | POA: Diagnosis not present

## 2023-03-29 DIAGNOSIS — H348321 Tributary (branch) retinal vein occlusion, left eye, with retinal neovascularization: Secondary | ICD-10-CM

## 2023-03-29 DIAGNOSIS — I10 Essential (primary) hypertension: Secondary | ICD-10-CM

## 2023-03-29 DIAGNOSIS — H43813 Vitreous degeneration, bilateral: Secondary | ICD-10-CM

## 2023-03-29 DIAGNOSIS — H35033 Hypertensive retinopathy, bilateral: Secondary | ICD-10-CM | POA: Diagnosis not present

## 2023-04-08 ENCOUNTER — Other Ambulatory Visit: Payer: Self-pay | Admitting: Urology

## 2023-04-19 ENCOUNTER — Encounter (INDEPENDENT_AMBULATORY_CARE_PROVIDER_SITE_OTHER): Payer: Medicare HMO | Admitting: Ophthalmology

## 2023-04-19 DIAGNOSIS — I10 Essential (primary) hypertension: Secondary | ICD-10-CM

## 2023-04-19 DIAGNOSIS — H35033 Hypertensive retinopathy, bilateral: Secondary | ICD-10-CM

## 2023-04-19 DIAGNOSIS — H43813 Vitreous degeneration, bilateral: Secondary | ICD-10-CM

## 2023-04-19 DIAGNOSIS — H59031 Cystoid macular edema following cataract surgery, right eye: Secondary | ICD-10-CM | POA: Diagnosis not present

## 2023-04-19 DIAGNOSIS — H348322 Tributary (branch) retinal vein occlusion, left eye, stable: Secondary | ICD-10-CM | POA: Diagnosis not present

## 2023-05-17 ENCOUNTER — Encounter (INDEPENDENT_AMBULATORY_CARE_PROVIDER_SITE_OTHER): Payer: Medicare HMO | Admitting: Ophthalmology

## 2023-05-17 DIAGNOSIS — H35033 Hypertensive retinopathy, bilateral: Secondary | ICD-10-CM

## 2023-05-17 DIAGNOSIS — H348322 Tributary (branch) retinal vein occlusion, left eye, stable: Secondary | ICD-10-CM

## 2023-05-17 DIAGNOSIS — H43813 Vitreous degeneration, bilateral: Secondary | ICD-10-CM

## 2023-05-17 DIAGNOSIS — I10 Essential (primary) hypertension: Secondary | ICD-10-CM

## 2023-05-17 DIAGNOSIS — H59031 Cystoid macular edema following cataract surgery, right eye: Secondary | ICD-10-CM

## 2023-06-14 ENCOUNTER — Encounter (INDEPENDENT_AMBULATORY_CARE_PROVIDER_SITE_OTHER): Payer: Medicare HMO | Admitting: Ophthalmology

## 2023-06-16 ENCOUNTER — Encounter (INDEPENDENT_AMBULATORY_CARE_PROVIDER_SITE_OTHER): Payer: Medicare HMO | Admitting: Ophthalmology

## 2023-06-16 DIAGNOSIS — H43813 Vitreous degeneration, bilateral: Secondary | ICD-10-CM

## 2023-06-16 DIAGNOSIS — I10 Essential (primary) hypertension: Secondary | ICD-10-CM | POA: Diagnosis not present

## 2023-06-16 DIAGNOSIS — H59031 Cystoid macular edema following cataract surgery, right eye: Secondary | ICD-10-CM

## 2023-06-16 DIAGNOSIS — H348322 Tributary (branch) retinal vein occlusion, left eye, stable: Secondary | ICD-10-CM

## 2023-06-16 DIAGNOSIS — H35033 Hypertensive retinopathy, bilateral: Secondary | ICD-10-CM

## 2023-06-16 DIAGNOSIS — H353122 Nonexudative age-related macular degeneration, left eye, intermediate dry stage: Secondary | ICD-10-CM

## 2023-07-26 ENCOUNTER — Encounter (INDEPENDENT_AMBULATORY_CARE_PROVIDER_SITE_OTHER): Payer: Medicare HMO | Admitting: Ophthalmology

## 2023-07-26 DIAGNOSIS — H348322 Tributary (branch) retinal vein occlusion, left eye, stable: Secondary | ICD-10-CM

## 2023-07-26 DIAGNOSIS — H59031 Cystoid macular edema following cataract surgery, right eye: Secondary | ICD-10-CM | POA: Diagnosis not present

## 2023-07-26 DIAGNOSIS — I10 Essential (primary) hypertension: Secondary | ICD-10-CM | POA: Diagnosis not present

## 2023-07-26 DIAGNOSIS — H43813 Vitreous degeneration, bilateral: Secondary | ICD-10-CM

## 2023-07-26 DIAGNOSIS — H35033 Hypertensive retinopathy, bilateral: Secondary | ICD-10-CM

## 2023-08-05 ENCOUNTER — Other Ambulatory Visit: Payer: Self-pay | Admitting: Urology

## 2023-09-06 ENCOUNTER — Encounter (INDEPENDENT_AMBULATORY_CARE_PROVIDER_SITE_OTHER): Payer: Medicare HMO | Admitting: Ophthalmology

## 2023-09-06 DIAGNOSIS — H35033 Hypertensive retinopathy, bilateral: Secondary | ICD-10-CM

## 2023-09-06 DIAGNOSIS — I10 Essential (primary) hypertension: Secondary | ICD-10-CM | POA: Diagnosis not present

## 2023-09-06 DIAGNOSIS — H348322 Tributary (branch) retinal vein occlusion, left eye, stable: Secondary | ICD-10-CM

## 2023-09-06 DIAGNOSIS — H59031 Cystoid macular edema following cataract surgery, right eye: Secondary | ICD-10-CM | POA: Diagnosis not present

## 2023-09-06 DIAGNOSIS — H43813 Vitreous degeneration, bilateral: Secondary | ICD-10-CM

## 2023-09-09 ENCOUNTER — Ambulatory Visit: Payer: Medicare HMO | Admitting: Nurse Practitioner

## 2023-09-09 NOTE — Progress Notes (Signed)
Name: Danniell Morvant DOB: [birth year] MRN: [id number]  History of Present Illness: Ms. Storti is a 77 y.o. female who presents today for follow up visit at [facility] [place]. - GU History: 1. Kidney stones. 2. OAB with urinary frequency, urgency, and urge incontinence.   At last visit on 03/12/2023: Doing well.   Since last visit: > 03/18/2023: KUB negative for stones.  Today: She denies recent stone passage. She denies flank pain or abdominal pain.  She reports  ongoing  urinary frequency, urgency, and urge incontinence. She states that remains manageable with the Vesicare 10 mg daily.   She denies any recent UTIs. She denies dysuria, gross hematuria, straining to void, or sensations of incomplete emptying.  She denies increased urinary urgency, frequency, nocturia, dysuria, gross hematuria, hesitancy, straining to void, or sensations of incomplete emptying.   Fall Screening: Do you usually have a device to assist in your mobility? No   Medications: Current Outpatient Medications  Medication Sig Dispense Refill   acetaZOLAMIDE (DIAMOX) 250 MG tablet Take 250 mg by mouth 2 (two) times daily.     ALPRAZolam (XANAX) 0.25 MG tablet Take 0.25 mg by mouth 3 (three) times daily as needed for anxiety.     amLODipine (NORVASC) 2.5 MG tablet Take 2.5 mg by mouth daily.     aspirin 81 MG chewable tablet Chew by mouth.     calcium acetate (PHOSLO) 667 MG capsule Take by mouth.     EPINEPHrine (EPIPEN) 0.3 mg/0.3 mL SOAJ injection Inject 0.3 mg into the muscle once.     glucosamine-chondroitin 500-400 MG tablet Take by mouth.     hydrocortisone 2.5 % cream Apply 1 application topically 3 (three) times daily.     ketorolac (ACULAR) 0.5 % ophthalmic solution SMARTSIG:In Eye(s)     levocetirizine (XYZAL) 5 MG tablet Take 5 mg by mouth every evening.     magnesium oxide (MAG-OX) 400 MG tablet Take 400 mg by mouth daily.     Multiple Vitamin (MULTIVITAMIN) capsule Take 1 capsule  by mouth daily.     omeprazole (PRILOSEC) 20 MG capsule Take 20 mg by mouth 2 (two) times daily before a meal.     prednisoLONE acetate (PRED FORTE) 1 % ophthalmic suspension SMARTSIG:In Eye(s)     PSYLLIUM PO      RESTASIS 0.05 % ophthalmic emulsion 1 drop 2 (two) times daily.     rosuvastatin (CRESTOR) 10 MG tablet Take 10 mg by mouth daily.     tamsulosin (FLOMAX) 0.4 MG CAPS capsule TAKE 1 CAPSULE BY MOUTH EVERY DAY AFTER SUPPER 90 capsule 1   terbinafine (LAMISIL) 250 MG tablet Take 250 mg by mouth daily.     timolol (TIMOPTIC) 0.5 % ophthalmic solution 1 drop every morning.     ondansetron (ZOFRAN ODT) 4 MG disintegrating tablet Take 1 tablet (4 mg total) by mouth every 8 (eight) hours as needed for nausea or vomiting. 20 tablet 0   oxyCODONE (ROXICODONE) 5 MG immediate release tablet Take 1 tablet (5 mg total) by mouth every 6 (six) hours as needed for severe pain. 30 tablet 0   solifenacin (VESICARE) 10 MG tablet Take 1 tablet (10 mg total) by mouth daily. 90 tablet 3   No current facility-administered medications for this visit.    Allergies: Allergies  Allergen Reactions   Bee Venom    Latex Rash    Past Medical History:  Diagnosis Date   Hyperlipidemia    Hypertension    History  reviewed. No pertinent surgical history. History reviewed. No pertinent family history. Social History   Socioeconomic History   Marital status: Widowed    Spouse name: Not on file   Number of children: Not on file   Years of education: Not on file   Highest education level: Not on file  Occupational History   Not on file  Tobacco Use   Smoking status: Never   Smokeless tobacco: Never  Substance and Sexual Activity   Alcohol use: Yes    Comment: occasionally   Drug use: Never   Sexual activity: Never  Other Topics Concern   Not on file  Social History Narrative   Not on file   Social Drivers of Health   Financial Resource Strain: Not on file  Food Insecurity: Not on file   Transportation Needs: Not on file  Physical Activity: Not on file  Stress: Not on file  Social Connections: Not on file  Intimate Partner Violence: Not on file    SUBJECTIVE  Review of Systems Constitutional: Patient denies any unintentional weight loss or change in strength lntegumentary: Patient denies any rashes or pruritus Cardiovascular: Patient denies chest pain or syncope Respiratory: Patient denies shortness of breath Gastrointestinal: Patient denies nausea, vomiting, constipation, or diarrhea Musculoskeletal: Patient denies muscle cramps or weakness Neurologic: Patient denies convulsions or seizures Allergic/Immunologic: Patient denies recent allergic reaction(s) Hematologic/Lymphatic: Patient denies bleeding tendencies Endocrine: Patient denies heat/cold intolerance  GU: As per HPI.  OBJECTIVE Vitals:   09/13/23 0905  BP: 118/74  Pulse: 67  Temp: 97.8 F (36.6 C)   There is no height or weight on file to calculate BMI.  Physical Examination Constitutional: No obvious distress; patient is non-toxic appearing  Cardiovascular: No visible lower extremity edema.  Respiratory: The patient does not have audible wheezing/stridor; respirations do not appear labored  Gastrointestinal: Abdomen non-distended Musculoskeletal: Normal ROM of UEs  Skin: No obvious rashes/open sores  Neurologic: CN 2-12 grossly intact Psychiatric: Answered questions appropriately with normal affect  Hematologic/Lymphatic/Immunologic: No obvious bruises or sites of spontaneous bleeding  UA: negative  PVR: 0 ml  ASSESSMENT Kidney stones - Plan: Urinalysis, Routine w reflex microscopic, BLADDER SCAN AMB NON-IMAGING  OAB (overactive bladder) - Plan: Urinalysis, Routine w reflex microscopic, BLADDER SCAN AMB NON-IMAGING, solifenacin (VESICARE) 10 MG tablet  She is doing well with no acute concerns. Refills sent for Vesicare. She declined KUB at this time for stone surveillance. Will plan to  follow up in 1 year with KUB for stone surveillance or sooner if needed. Pt verbalized understanding and agreement. All questions were answered.  PLAN Advised the following: Continue Vesicare 10 mg daily. Return in about 1 year (around 09/12/2024) for KUB, UA, PVR, & f/u with Evette Georges NP.  Orders Placed This Encounter  Procedures   Urinalysis, Routine w reflex microscopic   BLADDER SCAN AMB NON-IMAGING    It has been explained that the patient is to follow regularly with their PCP in addition to all other providers involved in their care and to follow instructions provided by these respective offices. Patient advised to contact urology clinic if any urologic-pertaining questions, concerns, new symptoms or problems arise in the interim period.  There are no Patient Instructions on file for this visit.  Electronically signed by:  Donnita Falls, MSN, FNP-C, CUNP 09/13/2023 9:37 AM

## 2023-09-13 ENCOUNTER — Ambulatory Visit (INDEPENDENT_AMBULATORY_CARE_PROVIDER_SITE_OTHER): Payer: Medicare HMO | Admitting: Nurse Practitioner

## 2023-09-13 ENCOUNTER — Ambulatory Visit: Payer: Medicare HMO | Admitting: Urology

## 2023-09-13 ENCOUNTER — Encounter: Payer: Self-pay | Admitting: Urology

## 2023-09-13 ENCOUNTER — Telehealth: Payer: Self-pay | Admitting: Nurse Practitioner

## 2023-09-13 ENCOUNTER — Encounter: Payer: Self-pay | Admitting: Nurse Practitioner

## 2023-09-13 VITALS — BP 118/74 | HR 67 | Temp 97.8°F

## 2023-09-13 VITALS — BP 131/80 | HR 2 | Ht 60.0 in | Wt 188.0 lb

## 2023-09-13 DIAGNOSIS — Z780 Asymptomatic menopausal state: Secondary | ICD-10-CM | POA: Insufficient documentation

## 2023-09-13 DIAGNOSIS — N200 Calculus of kidney: Secondary | ICD-10-CM | POA: Diagnosis not present

## 2023-09-13 DIAGNOSIS — E7800 Pure hypercholesterolemia, unspecified: Secondary | ICD-10-CM

## 2023-09-13 DIAGNOSIS — I10 Essential (primary) hypertension: Secondary | ICD-10-CM | POA: Diagnosis not present

## 2023-09-13 DIAGNOSIS — Z0001 Encounter for general adult medical examination with abnormal findings: Secondary | ICD-10-CM | POA: Insufficient documentation

## 2023-09-13 DIAGNOSIS — F1320 Sedative, hypnotic or anxiolytic dependence, uncomplicated: Secondary | ICD-10-CM | POA: Diagnosis not present

## 2023-09-13 DIAGNOSIS — N3281 Overactive bladder: Secondary | ICD-10-CM | POA: Diagnosis not present

## 2023-09-13 DIAGNOSIS — Z0000 Encounter for general adult medical examination without abnormal findings: Secondary | ICD-10-CM | POA: Insufficient documentation

## 2023-09-13 LAB — URINALYSIS, ROUTINE W REFLEX MICROSCOPIC
Bilirubin, UA: NEGATIVE
Glucose, UA: NEGATIVE
Ketones, UA: NEGATIVE
Leukocytes,UA: NEGATIVE
Nitrite, UA: NEGATIVE
Protein,UA: NEGATIVE
RBC, UA: NEGATIVE
Specific Gravity, UA: 1.03 (ref 1.005–1.030)
Urobilinogen, Ur: 0.2 mg/dL (ref 0.2–1.0)
pH, UA: 6 (ref 5.0–7.5)

## 2023-09-13 LAB — LIPID PANEL

## 2023-09-13 LAB — BLADDER SCAN AMB NON-IMAGING: Scan Result: 0

## 2023-09-13 MED ORDER — SOLIFENACIN SUCCINATE 10 MG PO TABS
10.0000 mg | ORAL_TABLET | Freq: Every day | ORAL | 3 refills | Status: DC
Start: 2023-09-13 — End: 2023-12-16

## 2023-09-13 NOTE — Progress Notes (Signed)
New Patient Office Visit  Subjective   Patient ID: Laura Buchanan, female    DOB: 02/02/1946  Age: 77 y.o. MRN: [id number]  CC:  Chief Complaint  Patient presents with   New Patient (Initial Visit)    HPI Laura Buchanan 77 year old years old female present with her daughter on September 13, 2019 for to establish care.  Past medical history of hypertension, GERD, hypercholesteremia, overactive bladder and anxiety for which client has been on Xanax for over 10 years.  Anxiety Reported diagnosis of anxiety by her PCP over 15 years ago after her husband passed away and has been on Xanax 3 times daily since then "it is for my nerves he calls me" discussed the possibility of decreased the dose to once a day as needed.  Hypertension She has a diagnosis of hypertension is currently being managed with amlodipine 2.5 mg.  T no side effect for medication, no no refills needed today.  Hypercholesterolemia: Currently being managed with Crestor 10 mg daily.  She has a BMI of 36.72, no recent lipid check within the last 2 years.  Will check lipid today and determine the need to possible dose adjustment    Outpatient Encounter Medications as of 09/13/2023  Medication Sig   acetaZOLAMIDE (DIAMOX) 250 MG tablet Take 250 mg by mouth 2 (two) times daily.   ALPRAZolam (XANAX) 0.25 MG tablet Take 0.25 mg by mouth 3 (three) times daily as needed for anxiety.   amLODipine (NORVASC) 2.5 MG tablet Take 2.5 mg by mouth daily.   aspirin 81 MG chewable tablet Chew by mouth.   calcium acetate (PHOSLO) 667 MG capsule Take by mouth.   EPINEPHrine (EPIPEN) 0.3 mg/0.3 mL SOAJ injection Inject 0.3 mg into the muscle once.   glucosamine-chondroitin 500-400 MG tablet Take by mouth.   hydrocortisone 2.5 % cream Apply 1 application topically 3 (three) times daily.   ketorolac (ACULAR) 0.5 % ophthalmic solution SMARTSIG:In Eye(s)   levocetirizine (XYZAL) 5 MG tablet Take 5 mg by mouth every evening.   magnesium oxide  (MAG-OX) 400 MG tablet Take 400 mg by mouth daily.   Multiple Vitamin (MULTIVITAMIN) capsule Take 1 capsule by mouth daily.   omeprazole (PRILOSEC) 20 MG capsule Take 20 mg by mouth 2 (two) times daily before a meal.   prednisoLONE acetate (PRED FORTE) 1 % ophthalmic suspension SMARTSIG:In Eye(s)   PSYLLIUM PO    RESTASIS 0.05 % ophthalmic emulsion 1 drop 2 (two) times daily.   rosuvastatin (CRESTOR) 10 MG tablet Take 10 mg by mouth daily.   solifenacin (VESICARE) 10 MG tablet Take 1 tablet (10 mg total) by mouth daily.   tamsulosin (FLOMAX) 0.4 MG CAPS capsule TAKE 1 CAPSULE BY MOUTH EVERY DAY AFTER SUPPER   terbinafine (LAMISIL) 250 MG tablet Take 250 mg by mouth daily.   timolol (TIMOPTIC) 0.5 % ophthalmic solution 1 drop every morning.   Travoprost, BAK Free, (TRAVATAN) 0.004 % SOLN ophthalmic solution Apply to eye.   [DISCONTINUED] ondansetron (ZOFRAN ODT) 4 MG disintegrating tablet Take 1 tablet (4 mg total) by mouth every 8 (eight) hours as needed for nausea or vomiting.   [DISCONTINUED] oxyCODONE (ROXICODONE) 5 MG immediate release tablet Take 1 tablet (5 mg total) by mouth every 6 (six) hours as needed for severe pain.   No facility-administered encounter medications on file as of 09/13/2023.    Past Medical History:  Diagnosis Date   Hyperlipidemia    Hypertension     History reviewed. No pertinent surgical history.  History reviewed. No pertinent family history.  Social History   Socioeconomic History   Marital status: Widowed    Spouse name: Not on file   Number of children: Not on file   Years of education: Not on file   Highest education level: Not on file  Occupational History   Not on file  Tobacco Use   Smoking status: Never   Smokeless tobacco: Never  Substance and Sexual Activity   Alcohol use: Yes    Comment: occasionally   Drug use: Never   Sexual activity: Never  Other Topics Concern   Not on file  Social History Narrative   Not on file    Social Drivers of Health   Financial Resource Strain: Low Risk  (09/13/2023)   Overall Financial Resource Strain (CARDIA)    Difficulty of Paying Living Expenses: Not very hard  Food Insecurity: No Food Insecurity (09/13/2023)   Hunger Vital Sign    Worried About Running Out of Food in the Last Year: Never true    Ran Out of Food in the Last Year: Never true  Transportation Needs: No Transportation Needs (09/13/2023)   PRAPARE - Administrator, Civil Service (Medical): No    Lack of Transportation (Non-Medical): No  Physical Activity: Not on file  Stress: Not on file  Social Connections: Not on file  Intimate Partner Violence: Not on file    Review of Systems  Constitutional:  Negative for chills and fever.  HENT:  Negative for ear pain and sore throat.   Eyes:  Positive for double vision.       Legally blind in the right eye is currently seeing an ophthalmologist  Respiratory:  Negative for cough and shortness of breath.   Cardiovascular:  Negative for chest pain and leg swelling.  Gastrointestinal:  Negative for constipation, diarrhea, nausea and vomiting.  Musculoskeletal:  Negative for falls.  Skin:  Negative for itching and rash.  Neurological:  Negative for dizziness and headaches.  Psychiatric/Behavioral:  Negative for suicidal ideas. The patient is nervous/anxious.        Has been taking Xanax 3 times daily for greater than 10 years   Negative unless indicated in HPI    Objective   BP 131/80   Pulse (!) 2   Ht 5' (1.524 m)   Wt 188 lb (85.3 kg)   SpO2 98%   BMI 36.72 kg/m   Physical Exam Vitals and nursing note reviewed.  Constitutional:      Appearance: Normal appearance. She is obese.  HENT:     Head: Normocephalic and atraumatic.     Nose: Nose normal.     Mouth/Throat:     Mouth: Mucous membranes are moist.  Eyes:     Extraocular Movements: Extraocular movements intact.     Conjunctiva/sclera: Conjunctivae normal.     Pupils: Pupils  are equal, round, and reactive to light.  Neck:     Vascular: No carotid bruit.  Cardiovascular:     Rate and Rhythm: Normal rate and regular rhythm.  Pulmonary:     Effort: Pulmonary effort is normal.     Breath sounds: Normal breath sounds.  Abdominal:     General: Bowel sounds are normal.     Palpations: Abdomen is soft.  Musculoskeletal:        General: Normal range of motion.     Cervical back: Normal range of motion and neck supple. No rigidity or tenderness.     Right lower leg:  No edema.     Left lower leg: No edema.  Lymphadenopathy:     Cervical: No cervical adenopathy.  Skin:    General: Skin is warm and dry.     Findings: No rash.  Neurological:     Mental Status: She is alert and oriented to person, place, and time.     Last CBC Lab Results  Component Value Date   WBC 12.9 (H) 02/15/2023   HGB 13.7 02/15/2023   HCT 42.3 02/15/2023   MCV 93.8 02/15/2023   MCH 30.4 02/15/2023   RDW 12.6 02/15/2023   PLT 176 02/15/2023   Last metabolic panel Lab Results  Component Value Date   GLUCOSE 104 (H) 02/15/2023   NA 140 02/15/2023   K 4.8 02/15/2023   CL 113 (H) 02/15/2023   CO2 17 (L) 02/15/2023   BUN 31 (H) 02/15/2023   CREATININE 1.04 (H) 02/15/2023   GFRNONAA 55 (L) 02/15/2023   CALCIUM 9.1 02/15/2023   PROT 7.4 02/15/2023   ALBUMIN 4.0 02/15/2023   BILITOT 0.8 02/15/2023   ALKPHOS 68 02/15/2023   AST 30 02/15/2023   ALT 35 02/15/2023   ANIONGAP 10 02/15/2023    Assessment & Plan:  Benign essential hypertension -     CMP14+EGFR  Pure hypercholesterolemia -     Lipid panel  Postmenopausal estrogen deficiency -     DG [facility] DEXA; Future  Benzodiazepine dependence (HCC)  Obesity, morbid (HCC) -     Lipid panel -     Thyroid Panel With TSH  Encounter for general adult medical examination with abnormal findings -     CBC with Differential/Platelet -     Thyroid Panel With TSH  [place] is an 77 year old Caucasian female seen today to  establish care, no acute distress Hypertension: Continue amlodipine 2.5 mg daily no refills needed today Hypercholesterolemia: Continue Crestor 10 mg daily no refill needed today Anxiety: Advised client to decrease her dose to twice daily as needed and may consider in the future decreasing it to once a day as needed Labs: CMP, CBC, lipid, TSH DEXA scan ordered  Return in about 3 months (around 12/12/2023).   Continue healthy lifestyle choices, including diet (rich in fruits, vegetables, and lean proteins, and low in salt and simple carbohydrates) and exercise (at least 30 minutes of moderate physical activity daily).     The above assessment and management plan was discussed with the patient. The patient verbalized understanding of and has agreed to the management plan. Patient is aware to call the clinic if they develop any new symptoms or if symptoms persist or worsen. Patient is aware when to return to the clinic for a follow-up visit. Patient educated on when it is appropriate to go to the emergency department.   Note: This document was prepared by [employer] voice dictation technology and any errors that results from this process are unintentional.

## 2023-09-14 LAB — CBC WITH DIFFERENTIAL/PLATELET
Basophils Absolute: 0 10*3/uL (ref 0.0–0.2)
Basos: 1 %
EOS (ABSOLUTE): 0.1 10*3/uL (ref 0.0–0.4)
Eos: 1 %
Hematocrit: 43.6 % (ref 34.0–46.6)
Hemoglobin: 14 g/dL (ref 11.1–15.9)
Immature Grans (Abs): 0 10*3/uL (ref 0.0–0.1)
Immature Granulocytes: 0 %
Lymphocytes Absolute: 2.1 10*3/uL (ref 0.7–3.1)
Lymphs: 26 %
MCH: 31.1 pg (ref 26.6–33.0)
MCHC: 32.1 g/dL (ref 31.5–35.7)
MCV: 97 fL (ref 79–97)
Monocytes Absolute: 0.9 10*3/uL (ref 0.1–0.9)
Monocytes: 10 %
Neutrophils Absolute: 5.1 10*3/uL (ref 1.4–7.0)
Neutrophils: 62 %
Platelets: 201 10*3/uL (ref 150–450)
RBC: 4.5 x10E6/uL (ref 3.77–5.28)
RDW: 12.3 % (ref 11.7–15.4)
WBC: 8.2 10*3/uL (ref 3.4–10.8)

## 2023-09-14 LAB — LIPID PANEL
Cholesterol, Total: 142 mg/dL (ref 100–199)
HDL: 58 mg/dL (ref >39)
LDL CALC COMMENT:: 2.4 ratio (ref 0.0–4.4)
LDL Chol Calc (NIH): 57 mg/dL (ref 0–99)
Triglycerides: 163 mg/dL — ABNORMAL HIGH (ref 0–149)
VLDL Cholesterol Cal: 27 mg/dL (ref 5–40)

## 2023-09-14 LAB — CMP14+EGFR
ALT: 34 IU/L — ABNORMAL HIGH (ref 0–32)
AST: 27 IU/L (ref 0–40)
Albumin: 4.2 g/dL (ref 3.8–4.8)
Alkaline Phosphatase: 83 IU/L (ref 44–121)
BUN/Creatinine Ratio: 22 (ref 12–28)
BUN: 21 mg/dL (ref 8–27)
Bilirubin Total: 0.2 mg/dL (ref 0.0–1.2)
CO2: 22 mmol/L (ref 20–29)
Calcium: 9.4 mg/dL (ref 8.7–10.3)
Chloride: 108 mmol/L — ABNORMAL HIGH (ref 96–106)
Creatinine, Ser: 0.97 mg/dL (ref 0.57–1.00)
Globulin, Total: 2.6 g/dL (ref 1.5–4.5)
Glucose: 105 mg/dL — ABNORMAL HIGH (ref 70–99)
Potassium: 3.9 mmol/L (ref 3.5–5.2)
Sodium: 143 mmol/L (ref 134–144)
Total Protein: 6.8 g/dL (ref 6.0–8.5)
eGFR: 59 mL/min/{1.73_m2} — ABNORMAL LOW (ref >59)

## 2023-09-14 LAB — THYROID PANEL WITH TSH
Free Thyroxine Index: 1.7 (ref 1.2–4.9)
T3 Uptake Ratio: 24 % (ref 24–39)
T4, Total: 6.9 ug/dL (ref 4.5–12.0)
TSH: 3.25 u[IU]/mL (ref 0.450–4.500)

## 2023-09-30 DIAGNOSIS — M79676 Pain in unspecified toe(s): Secondary | ICD-10-CM | POA: Diagnosis not present

## 2023-09-30 DIAGNOSIS — B351 Tinea unguium: Secondary | ICD-10-CM | POA: Diagnosis not present

## 2023-10-01 NOTE — Telephone Encounter (Signed)
 Appt made for 10/14/2023

## 2023-10-07 ENCOUNTER — Telehealth: Payer: Self-pay | Admitting: Nurse Practitioner

## 2023-10-07 NOTE — Telephone Encounter (Signed)
 Yes ok

## 2023-10-07 NOTE — Telephone Encounter (Signed)
 Requesting to switch providers. Would like Jannifer Rodney to be her PCP because that is who sees her daughter, Aggie Cosier.   Both Dois Davenport and [place] okay with this?  If so, we will call patient to change her March appt to see Christy.

## 2023-10-08 DIAGNOSIS — H401133 Primary open-angle glaucoma, bilateral, severe stage: Secondary | ICD-10-CM | POA: Diagnosis not present

## 2023-10-14 ENCOUNTER — Ambulatory Visit (INDEPENDENT_AMBULATORY_CARE_PROVIDER_SITE_OTHER): Payer: Medicare Other

## 2023-10-14 DIAGNOSIS — Z780 Asymptomatic menopausal state: Secondary | ICD-10-CM

## 2023-10-15 DIAGNOSIS — M8589 Other specified disorders of bone density and structure, multiple sites: Secondary | ICD-10-CM | POA: Diagnosis not present

## 2023-10-15 DIAGNOSIS — Z780 Asymptomatic menopausal state: Secondary | ICD-10-CM | POA: Diagnosis not present

## 2023-10-19 ENCOUNTER — Other Ambulatory Visit: Payer: Self-pay | Admitting: Nurse Practitioner

## 2023-10-19 DIAGNOSIS — M8000XA Age-related osteoporosis with current pathological fracture, unspecified site, initial encounter for fracture: Secondary | ICD-10-CM

## 2023-10-19 MED ORDER — ALENDRONATE SODIUM 70 MG PO TABS: 70.0000 mg | ORAL_TABLET | ORAL | 2 refills | Status: DC

## 2023-10-19 NOTE — Progress Notes (Signed)
[place]

## 2023-11-08 DIAGNOSIS — H0102A Squamous blepharitis right eye, upper and lower eyelids: Secondary | ICD-10-CM | POA: Diagnosis not present

## 2023-11-08 DIAGNOSIS — H04123 Dry eye syndrome of bilateral lacrimal glands: Secondary | ICD-10-CM | POA: Diagnosis not present

## 2023-11-08 DIAGNOSIS — H0102B Squamous blepharitis left eye, upper and lower eyelids: Secondary | ICD-10-CM | POA: Diagnosis not present

## 2023-11-08 DIAGNOSIS — H401132 Primary open-angle glaucoma, bilateral, moderate stage: Secondary | ICD-10-CM | POA: Diagnosis not present

## 2023-11-08 DIAGNOSIS — H35351 Cystoid macular degeneration, right eye: Secondary | ICD-10-CM | POA: Diagnosis not present

## 2023-11-08 DIAGNOSIS — Z961 Presence of intraocular lens: Secondary | ICD-10-CM | POA: Diagnosis not present

## 2023-11-08 DIAGNOSIS — H353121 Nonexudative age-related macular degeneration, left eye, early dry stage: Secondary | ICD-10-CM | POA: Diagnosis not present

## 2023-11-08 DIAGNOSIS — H33052 Total retinal detachment, left eye: Secondary | ICD-10-CM | POA: Diagnosis not present

## 2023-11-11 ENCOUNTER — Encounter (INDEPENDENT_AMBULATORY_CARE_PROVIDER_SITE_OTHER): Payer: Medicare Other | Admitting: Ophthalmology

## 2023-11-11 DIAGNOSIS — I10 Essential (primary) hypertension: Secondary | ICD-10-CM

## 2023-11-11 DIAGNOSIS — H43813 Vitreous degeneration, bilateral: Secondary | ICD-10-CM

## 2023-11-11 DIAGNOSIS — H348322 Tributary (branch) retinal vein occlusion, left eye, stable: Secondary | ICD-10-CM | POA: Diagnosis not present

## 2023-11-11 DIAGNOSIS — H59031 Cystoid macular edema following cataract surgery, right eye: Secondary | ICD-10-CM

## 2023-11-11 DIAGNOSIS — H35033 Hypertensive retinopathy, bilateral: Secondary | ICD-10-CM

## 2023-11-22 ENCOUNTER — Encounter (INDEPENDENT_AMBULATORY_CARE_PROVIDER_SITE_OTHER): Payer: Medicare HMO | Admitting: Ophthalmology

## 2023-11-22 DIAGNOSIS — H401120 Primary open-angle glaucoma, left eye, stage unspecified: Secondary | ICD-10-CM | POA: Diagnosis not present

## 2023-11-22 DIAGNOSIS — Z79899 Other long term (current) drug therapy: Secondary | ICD-10-CM | POA: Diagnosis not present

## 2023-11-22 DIAGNOSIS — H5789 Other specified disorders of eye and adnexa: Secondary | ICD-10-CM | POA: Diagnosis not present

## 2023-11-22 DIAGNOSIS — H401133 Primary open-angle glaucoma, bilateral, severe stage: Secondary | ICD-10-CM | POA: Diagnosis not present

## 2023-11-30 ENCOUNTER — Ambulatory Visit

## 2023-11-30 VITALS — Ht 60.0 in | Wt 188.0 lb

## 2023-11-30 DIAGNOSIS — Z0000 Encounter for general adult medical examination without abnormal findings: Secondary | ICD-10-CM

## 2023-11-30 NOTE — Patient Instructions (Signed)
 Ms. Laura Buchanan , Thank you for taking time to come for your Medicare Wellness Visit. I appreciate your ongoing commitment to your health goals. Please review the following plan we discussed and let me know if I can assist you in the future.   Referrals/Orders/Follow-Ups/Clinician Recommendations: Aim for 30 minutes of exercise or brisk walking, 6-8 glasses of water, and 5 servings of fruits and vegetables each day.  This is a list of the screening recommended for you and due dates:  Health Maintenance  Topic Date Due   COVID-19 Vaccine (3 - Moderna risk series) 06/21/2020   Hepatitis C Screening  09/12/2024*   Medicare Annual Wellness Visit  11/29/2024   DTaP/Tdap/Td vaccine (2 - Td or Tdap) 09/10/2028   Pneumonia Vaccine  Completed   Flu Shot  Completed   DEXA scan (bone density measurement)  Completed   Zoster (Shingles) Vaccine  Completed   HPV Vaccine  Aged Out  *Topic was postponed. The date shown is not the original due date.    Advanced directives: (ACP Link)Information on Advanced Care Planning can be found at [facility] [place] Advance Health Care Directives Advance Health Care Directives ([email or web address]   Next Medicare Annual Wellness Visit scheduled for next year: Yes

## 2023-11-30 NOTE — Progress Notes (Signed)
 Subjective:   Laura Buchanan is a 78 y.o. who presents for a Medicare Wellness preventive visit.  Visit Complete: Virtual I connected with  Lorenza Evangelist on 11/30/23 by a audio enabled telemedicine application and verified that I am speaking with the correct person using two identifiers.  Patient Location: Home  Provider Location: Home Office  I discussed the limitations of evaluation and management by telemedicine. The patient expressed understanding and agreed to proceed.  Vital Signs: Because this visit was a virtual/telehealth visit, some criteria may be missing or patient reported. Any vitals not documented were not able to be obtained and vitals that have been documented are patient reported.  VideoDeclined- This patient declined Librarian, academic. Therefore the visit was completed with audio only.  AWV Questionnaire: No: Patient Medicare AWV questionnaire was not completed prior to this visit.  Cardiac Risk Factors include: advanced age (>55men, >50 women);hypertension     Objective:    Today's Vitals   11/30/23 1731  Weight: 188 lb (85.3 kg)  Height: 5' (1.524 m)   Body mass index is 36.72 kg/m.     11/30/2023    5:34 PM 02/15/2023    7:19 PM 09/10/2018    2:12 AM  Advanced Directives  Does Patient Have a Medical Advance Directive? No Yes No  Type of Advance Directive  Living will   Would patient like information on creating a medical advance directive? Yes (MAU/Ambulatory/Procedural Areas - Information given)  No - Patient declined    Current Medications (verified) Outpatient Encounter Medications as of 11/30/2023  Medication Sig   acetaZOLAMIDE (DIAMOX) 250 MG tablet Take 250 mg by mouth 2 (two) times daily.   alendronate (FOSAMAX) 70 MG tablet Take 1 tablet (70 mg total) by mouth once a week. Take with a full glass of water on an empty stomach.   ALPRAZolam (XANAX) 0.25 MG tablet Take 0.25 mg by mouth 3 (three) times daily as needed  for anxiety.   amLODipine (NORVASC) 2.5 MG tablet Take 2.5 mg by mouth daily.   aspirin 81 MG chewable tablet Chew by mouth.   calcium acetate (PHOSLO) 667 MG capsule Take by mouth.   EPINEPHrine (EPIPEN) 0.3 mg/0.3 mL SOAJ injection Inject 0.3 mg into the muscle once.   glucosamine-chondroitin 500-400 MG tablet Take by mouth.   hydrocortisone 2.5 % cream Apply 1 application topically 3 (three) times daily.   ketorolac (ACULAR) 0.5 % ophthalmic solution SMARTSIG:In Eye(s)   levocetirizine (XYZAL) 5 MG tablet Take 5 mg by mouth every evening.   magnesium oxide (MAG-OX) 400 MG tablet Take 400 mg by mouth daily.   Multiple Vitamin (MULTIVITAMIN) capsule Take 1 capsule by mouth daily.   omeprazole (PRILOSEC) 20 MG capsule Take 20 mg by mouth 2 (two) times daily before a meal.   prednisoLONE acetate (PRED FORTE) 1 % ophthalmic suspension SMARTSIG:In Eye(s)   PSYLLIUM PO    RESTASIS 0.05 % ophthalmic emulsion 1 drop 2 (two) times daily.   rosuvastatin (CRESTOR) 10 MG tablet Take 10 mg by mouth daily.   solifenacin (VESICARE) 10 MG tablet Take 1 tablet (10 mg total) by mouth daily.   tamsulosin (FLOMAX) 0.4 MG CAPS capsule TAKE 1 CAPSULE BY MOUTH EVERY DAY AFTER SUPPER   terbinafine (LAMISIL) 250 MG tablet Take 250 mg by mouth daily.   timolol (TIMOPTIC) 0.5 % ophthalmic solution 1 drop every morning.   Travoprost, BAK Free, (TRAVATAN) 0.004 % SOLN ophthalmic solution Apply to eye.   No facility-administered encounter  medications on file as of 11/30/2023.    Allergies (verified) Bee pollen, Banana, Bee venom, and Latex   History: Past Medical History:  Diagnosis Date   Hyperlipidemia    Hypertension    History reviewed. No pertinent surgical history. History reviewed. No pertinent family history. Social History   Socioeconomic History   Marital status: Widowed    Spouse name: Not on file   Number of children: Not on file   Years of education: Not on file   Highest education level:  Not on file  Occupational History   Not on file  Tobacco Use   Smoking status: Never   Smokeless tobacco: Never  Substance and Sexual Activity   Alcohol use: Yes    Comment: occasionally   Drug use: Never   Sexual activity: Never  Other Topics Concern   Not on file  Social History Narrative   Not on file   Social Drivers of Health   Financial Resource Strain: Low Risk  (11/30/2023)   Overall Financial Resource Strain (CARDIA)    Difficulty of Paying Living Expenses: Not very hard  Food Insecurity: No Food Insecurity (11/30/2023)   Hunger Vital Sign    Worried About Running Out of Food in the Last Year: Never true    Ran Out of Food in the Last Year: Never true  Transportation Needs: No Transportation Needs (11/30/2023)   PRAPARE - Administrator, Civil Service (Medical): No    Lack of Transportation (Non-Medical): No  Physical Activity: Insufficiently Active (11/30/2023)   Exercise Vital Sign    Days of Exercise per Week: 3 days    Minutes of Exercise per Session: 30 min  Stress: No Stress Concern Present (11/30/2023)   [employer] of Occupational Health - Occupational Stress Questionnaire    Feeling of Stress : Not at all  Social Connections: Moderately Isolated (11/30/2023)   Social Connection and Isolation Panel [NHANES]    Frequency of Communication with Friends and Family: More than three times a week    Frequency of Social Gatherings with Friends and Family: Three times a week    Attends Religious Services: 1 to 4 times per year    Active Member of Clubs or Organizations: No    Attends Banker Meetings: Never    Marital Status: Widowed    Tobacco Counseling Counseling given: Not Answered    Clinical Intake:  Pre-visit preparation completed: Yes  Pain : No/denies pain     Diabetes: No  How often do you need to have someone help you when you read instructions, pamphlets, or other written materials from your doctor or pharmacy?: 1 -  Never  Interpreter Needed?: No  Information entered by :: Kandis Fantasia LPN   Activities of Daily Living     11/30/2023    5:34 PM  In your present state of health, do you have any difficulty performing the following activities:  Hearing? 0  Vision? 0  Difficulty concentrating or making decisions? 0  Walking or climbing stairs? 0  Dressing or bathing? 0  Doing errands, shopping? 0  Preparing Food and eating ? N  Using the Toilet? N  In the past six months, have you accidently leaked urine? N  Do you have problems with loss of bowel control? N  Managing your Medications? N  Managing your Finances? N  Housekeeping or managing your Housekeeping? N    Patient Care Team: Evern Bio, Dois Davenport, NP as PCP - General (Nurse Practitioner)  Indicate any recent Medical Services you may have received from other than [facility] providers in the past year (date may be approximate).     Assessment:   This is a routine wellness examination for Laura Buchanan.  Hearing/Vision screen Hearing Screening - Comments:: Some hearing loss per patient   Vision Screening - Comments:: Wears rx glasses - up to date with routine eye exams with Dr. Ashley Royalty     Goals Addressed             This Visit's Progress    Remain active and independent         Depression Screen     11/30/2023    5:33 PM 09/13/2023    4:04 PM  PHQ 2/9 Scores  PHQ - 2 Score 0 0  PHQ- 9 Score  0    Fall Risk     11/30/2023    5:34 PM 09/13/2023    4:25 PM  Fall Risk   Falls in the past year? 0 0  Number falls in past yr: 0 0  Injury with Fall? 0 0  Risk for fall due to : No Fall Risks No Fall Risks  Follow up Falls prevention discussed;Education provided;Falls evaluation completed Falls evaluation completed    MEDICARE RISK AT HOME:  Medicare Risk at Home Any stairs in or around the home?: No If so, are there any without handrails?: No Home free of loose throw rugs in walkways, pet beds, electrical cords, etc?:  Yes Adequate lighting in your home to reduce risk of falls?: Yes Life alert?: No Use of a cane, walker or w/c?: No Grab bars in the bathroom?: Yes Shower chair or bench in shower?: No Elevated toilet seat or a handicapped toilet?: Yes  TIMED UP AND GO:  Was the test performed?  No  Cognitive Function: 6CIT completed        11/30/2023    5:34 PM  6CIT Screen  What Year? 0 points  What month? 0 points  What time? 0 points  Count back from 20 0 points  Months in reverse 0 points  Repeat phrase 0 points  Total Score 0 points    Immunizations Immunization History  Administered Date(s) Administered   Fluad Quad(high Dose 65+) 07/14/2023   Fluzone Influenza virus vaccine,trivalent (IIV3), split virus 10/16/2020   Influenza, High Dose Seasonal PF 07/23/2015, 07/22/2016, 08/09/2017, 07/20/2019, 07/12/2020, 08/04/2020, 07/24/2021, 06/23/2022   Influenza,inj,Quad PF,6+ Mos 09/14/2018   Influenza,inj,Quad PF,6-35 Mos 09/14/2018   Moderna Sars-Covid-2 Vaccination 04/26/2020, 05/24/2020   PNEUMOCOCCAL CONJUGATE-20 10/14/2022   Pneumococcal Conjugate-13 02/01/2017   Pneumococcal Polysaccharide-23 07/05/2013   Tdap 09/10/2018   Zoster Recombinant(Shingrix) 07/24/2021, 03/10/2022   Zoster, Live 09/12/2013    Screening Tests Health Maintenance  Topic Date Due   COVID-19 Vaccine (3 - Moderna risk series) 06/21/2020   Hepatitis C Screening  09/12/2024 (Originally 12/10/1963)   Medicare Annual Wellness (AWV)  11/29/2024   DTaP/Tdap/Td (2 - Td or Tdap) 09/10/2028   Pneumonia Vaccine 39+ Years old  Completed   INFLUENZA VACCINE  Completed   DEXA SCAN  Completed   Zoster Vaccines- Shingrix  Completed   HPV VACCINES  Aged Out    Health Maintenance  Health Maintenance Due  Topic Date Due   COVID-19 Vaccine (3 - Moderna risk series) 06/21/2020    Additional Screening:  Vision Screening: Recommended annual ophthalmology exams for early detection of glaucoma and other disorders of  the eye.  Dental Screening: Recommended annual dental exams for proper oral hygiene  Community Resource Referral / Chronic Care Management: CRR required this visit?  No   CCM required this visit?  No     Plan:     I have personally reviewed and noted the following in the patient's chart:   Medical and social history Use of alcohol, tobacco or illicit drugs  Current medications and supplements including opioid prescriptions. Patient is not currently taking opioid prescriptions. Functional ability and status Nutritional status Physical activity Advanced directives List of other physicians Hospitalizations, surgeries, and ER visits in previous 12 months Vitals Screenings to include cognitive, depression, and falls Referrals and appointments  In addition, I have reviewed and discussed with patient certain preventive protocols, quality metrics, and best practice recommendations. A written personalized care plan for preventive services as well as general preventive health recommendations were provided to patient.     Kandis Fantasia [place], [place]   09/28/9145   After Visit Summary: (MyChart) Due to this being a telephonic visit, the after visit summary with patients personalized plan was offered to patient via MyChart   Notes: Nothing significant to report at this time.

## 2023-12-15 ENCOUNTER — Ambulatory Visit: Payer: Medicare HMO | Admitting: Nurse Practitioner

## 2023-12-15 ENCOUNTER — Other Ambulatory Visit: Payer: Medicare HMO

## 2023-12-16 ENCOUNTER — Ambulatory Visit: Payer: Medicare Other | Admitting: Family

## 2023-12-16 ENCOUNTER — Encounter: Payer: Self-pay | Admitting: Family

## 2023-12-16 VITALS — BP 137/84 | HR 72 | Temp 97.0°F | Wt 188.0 lb

## 2023-12-16 DIAGNOSIS — F1320 Sedative, hypnotic or anxiolytic dependence, uncomplicated: Secondary | ICD-10-CM

## 2023-12-16 DIAGNOSIS — Z79899 Other long term (current) drug therapy: Secondary | ICD-10-CM

## 2023-12-16 DIAGNOSIS — I10 Essential (primary) hypertension: Secondary | ICD-10-CM

## 2023-12-16 DIAGNOSIS — Z0001 Encounter for general adult medical examination with abnormal findings: Secondary | ICD-10-CM

## 2023-12-16 DIAGNOSIS — Z79891 Long term (current) use of opiate analgesic: Secondary | ICD-10-CM | POA: Diagnosis not present

## 2023-12-16 DIAGNOSIS — K2100 Gastro-esophageal reflux disease with esophagitis, without bleeding: Secondary | ICD-10-CM | POA: Diagnosis not present

## 2023-12-16 DIAGNOSIS — Z0000 Encounter for general adult medical examination without abnormal findings: Secondary | ICD-10-CM | POA: Diagnosis not present

## 2023-12-16 DIAGNOSIS — N200 Calculus of kidney: Secondary | ICD-10-CM

## 2023-12-16 DIAGNOSIS — Z6836 Body mass index (BMI) 36.0-36.9, adult: Secondary | ICD-10-CM

## 2023-12-16 DIAGNOSIS — H409 Unspecified glaucoma: Secondary | ICD-10-CM

## 2023-12-16 DIAGNOSIS — E7800 Pure hypercholesterolemia, unspecified: Secondary | ICD-10-CM | POA: Diagnosis not present

## 2023-12-16 DIAGNOSIS — N3281 Overactive bladder: Secondary | ICD-10-CM

## 2023-12-16 DIAGNOSIS — F419 Anxiety disorder, unspecified: Secondary | ICD-10-CM

## 2023-12-16 MED ORDER — ALPRAZOLAM 0.25 MG PO TABS: 0.2500 mg | ORAL_TABLET | Freq: Every day | ORAL | 2 refills | Status: DC

## 2023-12-16 MED ORDER — OXYBUTYNIN CHLORIDE ER 10 MG PO TB24: 10.0000 mg | ORAL_TABLET | Freq: Every day | ORAL | 2 refills | Status: DC

## 2023-12-16 NOTE — Patient Instructions (Signed)

## 2023-12-16 NOTE — Progress Notes (Signed)
 Subjective:    Patient ID: Laura Buchanan, female    DOB: Feb 17, 1946, 78 y.o.   MRN: [id number]  Chief Complaint  Patient presents with   Annual Exam   Pt presents to the office today CPE and to establish care with me.   She is followed by Urologists every 6 months for hx of kidney stones and OAB. She has been using vesicare, but states this is causing dry mouth and continues to have urinary frequency.   She is followed by ophthalmologist for glaucoma and legally blind in her left eye.  Hypertension This is a chronic problem. The current episode started more than 1 year ago. The problem has been resolved since onset. The problem is controlled. Associated symptoms include anxiety and malaise/fatigue. Pertinent negatives include no peripheral edema or shortness of breath. Risk factors for coronary artery disease include obesity and sedentary lifestyle. The current treatment provides moderate improvement.  Gastroesophageal Reflux She complains of belching and heartburn. She reports no nausea. This is a chronic problem. The current episode started more than 1 year ago. The problem occurs occasionally. Risk factors include obesity. She has tried a PPI for the symptoms. The treatment provided moderate relief.  Urinary Frequency  This is a chronic problem. The current episode started more than 1 year ago. The problem occurs every urination. The problem has been gradually worsening. Associated symptoms include frequency and urgency. Pertinent negatives include no nausea or vomiting. Treatments tried: vesicare. The treatment provided mild relief.  Hyperlipidemia This is a chronic problem. The current episode started more than 1 year ago. The problem is controlled. Recent lipid tests were reviewed and are normal. Exacerbating diseases include obesity. Pertinent negatives include no shortness of breath. Current antihyperlipidemic treatment includes statins. The current treatment provides moderate  improvement of lipids. Risk factors for coronary artery disease include dyslipidemia, hypertension and a sedentary lifestyle.  Anxiety Presents for follow-up visit. Symptoms include excessive worry, nervous/anxious behavior and restlessness. Patient reports no nausea or shortness of breath. Symptoms occur occasionally. The severity of symptoms is mild.        Review of Systems  Constitutional:  Positive for malaise/fatigue.  Respiratory:  Negative for shortness of breath.   Gastrointestinal:  Positive for heartburn. Negative for nausea and vomiting.  Genitourinary:  Positive for frequency and urgency.  Psychiatric/Behavioral:  The patient is nervous/anxious.   All other systems reviewed and are negative.   Social History   Socioeconomic History   Marital status: Widowed    Spouse name: Not on file   Number of children: Not on file   Years of education: Not on file   Highest education level: Associate degree: occupational, Scientist, product/process development, or vocational program  Occupational History   Not on file  Tobacco Use   Smoking status: Never   Smokeless tobacco: Never  Substance and Sexual Activity   Alcohol use: Yes    Comment: occasionally   Drug use: Never   Sexual activity: Never  Other Topics Concern   Not on file  Social History Narrative   Not on file   Social Drivers of Health   Financial Resource Strain: Low Risk  (12/15/2023)   Overall Financial Resource Strain (CARDIA)    Difficulty of Paying Living Expenses: Not very hard  Food Insecurity: No Food Insecurity (12/15/2023)   Hunger Vital Sign    Worried About Running Out of Food in the Last Year: Never true    Ran Out of Food in the Last Year: Never true  Transportation Needs: No Transportation Needs (12/15/2023)   PRAPARE - Administrator, Civil Service (Medical): No    Lack of Transportation (Non-Medical): No  Physical Activity: Inactive (12/15/2023)   Exercise Vital Sign    Days of Exercise per Week: 0 days     Minutes of Exercise per Session: 30 min  Stress: No Stress Concern Present (12/15/2023)   [employer] of Occupational Health - Occupational Stress Questionnaire    Feeling of Stress : Not at all  Social Connections: Socially Isolated (12/15/2023)   Social Connection and Isolation Panel [NHANES]    Frequency of Communication with Friends and Family: Once a week    Frequency of Social Gatherings with Friends and Family: Once a week    Attends Religious Services: Never    Database administrator or Organizations: No    Attends Banker Meetings: Never    Marital Status: Widowed   History reviewed. No pertinent family history.      Objective:   Physical Exam Vitals reviewed.  Constitutional:      General: She is not in acute distress.    Appearance: She is well-developed. She is obese.  HENT:     Head: Normocephalic and atraumatic.     Right Ear: Tympanic membrane normal.     Left Ear: Tympanic membrane normal.  Eyes:     Pupils: Pupils are equal, round, and reactive to light.  Neck:     Thyroid: No thyromegaly.  Cardiovascular:     Rate and Rhythm: Normal rate and regular rhythm.     Heart sounds: Normal heart sounds. No murmur heard. Pulmonary:     Effort: Pulmonary effort is normal. No respiratory distress.     Breath sounds: Normal breath sounds. No wheezing.  Abdominal:     General: Bowel sounds are normal. There is no distension.     Palpations: Abdomen is soft.     Tenderness: There is no abdominal tenderness.  Musculoskeletal:        General: No tenderness. Normal range of motion.     Cervical back: Normal range of motion and neck supple.     Right lower leg: Edema (trace) present.     Left lower leg: Edema (trace) present.  Skin:    General: Skin is warm and dry.  Neurological:     Mental Status: She is alert and oriented to person, place, and time.     Cranial Nerves: No cranial nerve deficit.     Deep Tendon Reflexes: Reflexes are normal  and symmetric.  Psychiatric:        Behavior: Behavior normal.        Thought Content: Thought content normal.        Judgment: Judgment normal.       BP 137/84   Pulse 72   Temp (!) 97 F (36.1 C) (Temporal)   Wt 188 lb (85.3 kg)   SpO2 97%   BMI 36.72 kg/m      Assessment & Plan:  Laura Buchanan comes in today with chief complaint of Annual Exam   Diagnosis and orders addressed:  1. Anxiety - ALPRAZolam (XANAX) 0.25 MG tablet; Take 1 tablet (0.25 mg total) by mouth daily.  Dispense: 30 tablet; Refill: 2 - CMP14+EGFR - CBC with Differential/Platelet - ToxASSURE Select 13 (MW), Urine  2. Benign essential hypertension - CMP14+EGFR - CBC with Differential/Platelet - ToxASSURE Select 13 (MW), Urine  3. Benzodiazepine dependence (HCC)  - CMP14+EGFR - CBC with  Differential/Platelet  4. Gastroesophageal reflux disease with esophagitis without hemorrhage - CMP14+EGFR - CBC with Differential/Platelet  5. OAB (overactive bladder) - oxybutynin (DITROPAN-XL) 10 MG 24 hr tablet; Take 1 tablet (10 mg total) by mouth at bedtime.  Dispense: 90 tablet; Refill: 2 - CMP14+EGFR - CBC with Differential/Platelet  6. Obesity, morbid (HCC)  - CMP14+EGFR - CBC with Differential/Platelet  7. Pure hypercholesterolemia - CMP14+EGFR - CBC with Differential/Platelet  8. Annual physical exam (Primary) - CMP14+EGFR - CBC with Differential/Platelet - Lipid panel  9. Kidney stones - CMP14+EGFR - CBC with Differential/Platelet  10. Glaucoma of both eyes, unspecified glaucoma type  - CMP14+EGFR - CBC with Differential/Platelet  11. Controlled substance agreement signed - ALPRAZolam (XANAX) 0.25 MG tablet; Take 1 tablet (0.25 mg total) by mouth daily.  Dispense: 30 tablet; Refill: 2 - CMP14+EGFR - CBC with Differential/Platelet   Labs pending PT had decreased xanax 0.25 mg to daily from TID. Discussed to continue to decrease to prn.  Patient reviewed in [place] controlled  database, no flags noted. Contract and drug screen are up to date.  Will change Vesicare to oxybutynin 10 mg  Continue current medications  Keep follow up with specialists  Health Maintenance reviewed Diet and exercise encouraged  Return in about 3 months (around 03/17/2024), or if symptoms worsen or fail to improve.    Jannifer Rodney, FNP

## 2023-12-17 LAB — CBC WITH DIFFERENTIAL/PLATELET
Basophils Absolute: 0 10*3/uL (ref 0.0–0.2)
Basos: 1 %
EOS (ABSOLUTE): 0.1 10*3/uL (ref 0.0–0.4)
Eos: 2 %
Hematocrit: 41.4 % (ref 34.0–46.6)
Hemoglobin: 13.8 g/dL (ref 11.1–15.9)
Immature Grans (Abs): 0 10*3/uL (ref 0.0–0.1)
Immature Granulocytes: 0 %
Lymphocytes Absolute: 1.5 10*3/uL (ref 0.7–3.1)
Lymphs: 24 %
MCH: 31.7 pg (ref 26.6–33.0)
MCHC: 33.3 g/dL (ref 31.5–35.7)
MCV: 95 fL (ref 79–97)
Monocytes Absolute: 0.7 10*3/uL (ref 0.1–0.9)
Monocytes: 10 %
Neutrophils Absolute: 4 10*3/uL (ref 1.4–7.0)
Neutrophils: 63 %
Platelets: 203 10*3/uL (ref 150–450)
RBC: 4.36 x10E6/uL (ref 3.77–5.28)
RDW: 12.1 % (ref 11.7–15.4)
WBC: 6.4 10*3/uL (ref 3.4–10.8)

## 2023-12-17 LAB — CMP14+EGFR
ALT: 28 IU/L (ref 0–32)
AST: 25 IU/L (ref 0–40)
Albumin: 4.2 g/dL (ref 3.8–4.8)
Alkaline Phosphatase: 74 IU/L (ref 44–121)
BUN/Creatinine Ratio: 26 (ref 12–28)
BUN: 20 mg/dL (ref 8–27)
Bilirubin Total: 0.3 mg/dL (ref 0.0–1.2)
CO2: 26 mmol/L (ref 20–29)
Calcium: 9.1 mg/dL (ref 8.7–10.3)
Chloride: 103 mmol/L (ref 96–106)
Creatinine, Ser: 0.76 mg/dL (ref 0.57–1.00)
Globulin, Total: 2.7 g/dL (ref 1.5–4.5)
Glucose: 82 mg/dL (ref 70–99)
Potassium: 4.2 mmol/L (ref 3.5–5.2)
Sodium: 142 mmol/L (ref 134–144)
Total Protein: 6.9 g/dL (ref 6.0–8.5)
eGFR: 79 mL/min/{1.73_m2} (ref >59)

## 2023-12-17 LAB — LIPID PANEL
Chol/HDL Ratio: 2.2 ratio (ref 0.0–4.4)
Cholesterol, Total: 127 mg/dL (ref 100–199)
HDL: 59 mg/dL
LDL Chol Calc (NIH): 54 mg/dL (ref 0–99)
Triglycerides: 66 mg/dL (ref 0–149)
VLDL Cholesterol Cal: 14 mg/dL (ref 5–40)

## 2023-12-20 LAB — TOXASSURE SELECT 13 (MW), URINE

## 2023-12-23 DIAGNOSIS — M79676 Pain in unspecified toe(s): Secondary | ICD-10-CM | POA: Diagnosis not present

## 2023-12-23 DIAGNOSIS — B351 Tinea unguium: Secondary | ICD-10-CM | POA: Diagnosis not present

## 2024-01-22 ENCOUNTER — Other Ambulatory Visit: Payer: Self-pay | Admitting: Nurse Practitioner

## 2024-01-22 DIAGNOSIS — M8000XA Age-related osteoporosis with current pathological fracture, unspecified site, initial encounter for fracture: Secondary | ICD-10-CM

## 2024-02-07 ENCOUNTER — Encounter (INDEPENDENT_AMBULATORY_CARE_PROVIDER_SITE_OTHER): Payer: Medicare Other | Admitting: Ophthalmology

## 2024-02-07 DIAGNOSIS — H34832 Tributary (branch) retinal vein occlusion, left eye, with macular edema: Secondary | ICD-10-CM

## 2024-02-07 DIAGNOSIS — H35033 Hypertensive retinopathy, bilateral: Secondary | ICD-10-CM

## 2024-02-07 DIAGNOSIS — H59031 Cystoid macular edema following cataract surgery, right eye: Secondary | ICD-10-CM

## 2024-02-07 DIAGNOSIS — I10 Essential (primary) hypertension: Secondary | ICD-10-CM

## 2024-02-07 DIAGNOSIS — H43813 Vitreous degeneration, bilateral: Secondary | ICD-10-CM

## 2024-02-09 DIAGNOSIS — H33052 Total retinal detachment, left eye: Secondary | ICD-10-CM | POA: Diagnosis not present

## 2024-02-09 DIAGNOSIS — H0102B Squamous blepharitis left eye, upper and lower eyelids: Secondary | ICD-10-CM | POA: Diagnosis not present

## 2024-02-09 DIAGNOSIS — H35351 Cystoid macular degeneration, right eye: Secondary | ICD-10-CM | POA: Diagnosis not present

## 2024-02-09 DIAGNOSIS — Z961 Presence of intraocular lens: Secondary | ICD-10-CM | POA: Diagnosis not present

## 2024-02-09 DIAGNOSIS — H04123 Dry eye syndrome of bilateral lacrimal glands: Secondary | ICD-10-CM | POA: Diagnosis not present

## 2024-02-09 DIAGNOSIS — H0102A Squamous blepharitis right eye, upper and lower eyelids: Secondary | ICD-10-CM | POA: Diagnosis not present

## 2024-02-09 DIAGNOSIS — H353121 Nonexudative age-related macular degeneration, left eye, early dry stage: Secondary | ICD-10-CM | POA: Diagnosis not present

## 2024-02-09 DIAGNOSIS — H401132 Primary open-angle glaucoma, bilateral, moderate stage: Secondary | ICD-10-CM | POA: Diagnosis not present

## 2024-03-09 DIAGNOSIS — M79672 Pain in left foot: Secondary | ICD-10-CM | POA: Diagnosis not present

## 2024-03-09 DIAGNOSIS — M79671 Pain in right foot: Secondary | ICD-10-CM | POA: Diagnosis not present

## 2024-03-09 DIAGNOSIS — B351 Tinea unguium: Secondary | ICD-10-CM | POA: Diagnosis not present

## 2024-03-20 ENCOUNTER — Ambulatory Visit (INDEPENDENT_AMBULATORY_CARE_PROVIDER_SITE_OTHER): Admitting: Family

## 2024-03-20 VITALS — BP 118/72 | HR 72 | Temp 97.4°F | Ht 60.0 in | Wt 184.4 lb

## 2024-03-20 DIAGNOSIS — I10 Essential (primary) hypertension: Secondary | ICD-10-CM

## 2024-03-20 DIAGNOSIS — N3281 Overactive bladder: Secondary | ICD-10-CM

## 2024-03-20 DIAGNOSIS — F419 Anxiety disorder, unspecified: Secondary | ICD-10-CM | POA: Diagnosis not present

## 2024-03-20 DIAGNOSIS — F1320 Sedative, hypnotic or anxiolytic dependence, uncomplicated: Secondary | ICD-10-CM

## 2024-03-20 DIAGNOSIS — M1711 Unilateral primary osteoarthritis, right knee: Secondary | ICD-10-CM | POA: Diagnosis not present

## 2024-03-20 DIAGNOSIS — H6123 Impacted cerumen, bilateral: Secondary | ICD-10-CM

## 2024-03-20 DIAGNOSIS — Z79899 Other long term (current) drug therapy: Secondary | ICD-10-CM

## 2024-03-20 DIAGNOSIS — K2100 Gastro-esophageal reflux disease with esophagitis, without bleeding: Secondary | ICD-10-CM

## 2024-03-20 MED ORDER — ALPRAZOLAM 0.25 MG PO TABS: 0.2500 mg | ORAL_TABLET | Freq: Every day | ORAL | 2 refills | Status: DC

## 2024-03-20 MED ORDER — DICLOFENAC SODIUM 75 MG PO TBEC: 75.0000 mg | DELAYED_RELEASE_TABLET | Freq: Two times a day (BID) | ORAL | 2 refills | Status: DC

## 2024-03-20 NOTE — Progress Notes (Signed)
 Subjective:    Patient ID: Laura Buchanan, female    DOB: [birth year], 78 y.o.   MRN: [id number]  Chief Complaint  Patient presents with   Medical Management of Chronic Issues    Left ear earring ripped out    Knee Pain    Right knee swollen leg and ankle also swelling. Getting up and down is hard.   Pt presents to the office today chronic follow up.   She is followed by Urologists every 6 months for hx of kidney stones and OAB. She has been using oxybutynin  10 mg.  She is followed by ophthalmologist for glaucoma and legally blind in her left eye.   She has an allergy to bee stings and requesting refill of her Epipen .  Hypertension This is a chronic problem. The current episode started more than 1 year ago. The problem has been resolved since onset. The problem is controlled. Associated symptoms include anxiety and malaise/fatigue. Pertinent negatives include no peripheral edema or shortness of breath. Risk factors for coronary artery disease include obesity and sedentary lifestyle. The current treatment provides moderate improvement.  Gastroesophageal Reflux She complains of belching and heartburn. She reports no nausea. This is a chronic problem. The current episode started more than 1 year ago. The problem occurs occasionally. The symptoms are aggravated by certain foods. Risk factors include obesity. She has tried a PPI for the symptoms. The treatment provided moderate relief.  Urinary Frequency  This is a chronic problem. The current episode started more than 1 year ago. The problem occurs every urination. The problem has been gradually worsening. Associated symptoms include frequency and urgency. Pertinent negatives include no nausea or vomiting. Treatments tried: oxybutynin . The treatment provided moderate relief.  Hyperlipidemia This is a chronic problem. The current episode started more than 1 year ago. The problem is controlled. Recent lipid tests were reviewed and are normal.  Exacerbating diseases include obesity. Pertinent negatives include no shortness of breath. Current antihyperlipidemic treatment includes statins. The current treatment provides moderate improvement of lipids. Risk factors for coronary artery disease include dyslipidemia, hypertension and a sedentary lifestyle.  Anxiety Presents for follow-up visit. Symptoms include excessive worry, nervous/anxious behavior and restlessness. Patient reports no nausea or shortness of breath. Symptoms occur occasionally. The severity of symptoms is mild.    Knee Pain  The incident occurred more than 1 week ago. There was no injury mechanism. The pain is present in the right knee. The pain is at a severity of 7/10. The pain is moderate. The pain has been Intermittent since onset. She reports no foreign bodies present. Nothing aggravates the symptoms. She has tried NSAIDs for the symptoms. The treatment provided mild relief.      Review of Systems  Constitutional:  Positive for malaise/fatigue.  Respiratory:  Negative for shortness of breath.   Gastrointestinal:  Positive for heartburn. Negative for nausea and vomiting.  Genitourinary:  Positive for frequency and urgency.  Psychiatric/Behavioral:  The patient is nervous/anxious.   All other systems reviewed and are negative.   Social History   Socioeconomic History   Marital status: Widowed    Spouse name: Not on file   Number of children: Not on file   Years of education: Not on file   Highest education level: GED or equivalent  Occupational History   Not on file  Tobacco Use   Smoking status: Never   Smokeless tobacco: Never  Substance and Sexual Activity   Alcohol use: Yes    Comment: occasionally  Drug use: Never   Sexual activity: Never  Other Topics Concern   Not on file  Social History Narrative   Not on file   Social Drivers of Health   Financial Resource Strain: Low Risk  (03/20/2024)   Overall Financial Resource Strain (CARDIA)     Difficulty of Paying Living Expenses: Not very hard  Food Insecurity: No Food Insecurity (03/20/2024)   Hunger Vital Sign    Worried About Running Out of Food in the Last Year: Never true    Ran Out of Food in the Last Year: Never true  Transportation Needs: No Transportation Needs (03/20/2024)   PRAPARE - Administrator, Civil Service (Medical): No    Lack of Transportation (Non-Medical): No  Physical Activity: Inactive (03/20/2024)   Exercise Vital Sign    Days of Exercise per Week: 0 days    Minutes of Exercise per Session: Not on file  Stress: No Stress Concern Present (03/20/2024)   [employer] of Occupational Health - Occupational Stress Questionnaire    Feeling of Stress: Not at all  Social Connections: Socially Isolated (03/20/2024)   Social Connection and Isolation Panel    Frequency of Communication with Friends and Family: Once a week    Frequency of Social Gatherings with Friends and Family: Once a week    Attends Religious Services: Never    Database administrator or Organizations: No    Attends Engineer, structural: Not on file    Marital Status: Widowed   History reviewed. No pertinent family history.      Objective:   Physical Exam Vitals reviewed.  Constitutional:      General: She is not in acute distress.    Appearance: She is well-developed. She is obese.  HENT:     Head: Normocephalic and atraumatic.     Right Ear: There is impacted cerumen.     Left Ear: There is impacted cerumen.   Eyes:     Pupils: Pupils are equal, round, and reactive to light.   Neck:     Thyroid : No thyromegaly.   Cardiovascular:     Rate and Rhythm: Normal rate and regular rhythm.     Heart sounds: Normal heart sounds. No murmur heard. Pulmonary:     Effort: Pulmonary effort is normal. No respiratory distress.     Breath sounds: Normal breath sounds. No wheezing.  Abdominal:     General: Bowel sounds are normal. There is no distension.      Palpations: Abdomen is soft.     Tenderness: There is no abdominal tenderness.   Musculoskeletal:        General: No tenderness.     Cervical back: Normal range of motion and neck supple.     Right lower leg: Edema (trace) present.     Left lower leg: Edema (trace) present.     Comments: Pain in right knee with flexion    Skin:    General: Skin is warm and dry.   Neurological:     Mental Status: She is alert and oriented to person, place, and time.     Cranial Nerves: No cranial nerve deficit.     Deep Tendon Reflexes: Reflexes are normal and symmetric.   Psychiatric:        Behavior: Behavior normal.        Thought Content: Thought content normal.        Judgment: Judgment normal.     Bilateral ears washed with warm  water and peroxide. Pt tolerated well. TM WNL  BP 118/72   Pulse 72   Temp (!) 97.4 F (36.3 C) (Temporal)   Ht 5' (1.524 m)   Wt 184 lb 6.4 oz (83.6 kg)   SpO2 97%   BMI 36.01 kg/m      Assessment & Plan:  Laura Buchanan comes in today with chief complaint of Medical Management of Chronic Issues (Left ear earring ripped out ) and Knee Pain (Right knee swollen leg and ankle also swelling. Getting up and down is hard.)   Diagnosis and orders addressed:  1. Anxiety - ALPRAZolam  (XANAX ) 0.25 MG tablet; Take 1 tablet (0.25 mg total) by mouth daily.  Dispense: 30 tablet; Refill: 2  2. Controlled substance agreement signed - ALPRAZolam  (XANAX ) 0.25 MG tablet; Take 1 tablet (0.25 mg total) by mouth daily.  Dispense: 30 tablet; Refill: 2  3. Benign essential hypertension (Primary)  4. Gastroesophageal reflux disease with esophagitis without hemorrhage  5. OAB (overactive bladder)  6. Benzodiazepine dependence (HCC)  7. Obesity, morbid (HCC)  8. Primary osteoarthritis of right knee - diclofenac (VOLTAREN) 75 MG EC tablet; Take 1 tablet (75 mg total) by mouth 2 (two) times daily.  Dispense: 60 tablet; Refill: 2  9. Bilateral impacted cerumen    PT  had decreased xanax  0.25 mg to daily from TID. Discussed to continue to decrease to prn.  Patient reviewed in  controlled database, no flags noted. Contract and drug screen are up to date.  Will start diclofenac BID with food, no other NSAID's.  Continue current medications  Keep follow up with specialists  Health Maintenance reviewed Diet and exercise encouraged  Return in about 3 months (around 06/20/2024), or if symptoms worsen or fail to improve.    Bari Learn, FNP

## 2024-03-20 NOTE — Patient Instructions (Signed)

## 2024-04-07 ENCOUNTER — Other Ambulatory Visit: Payer: Self-pay | Admitting: Urology

## 2024-04-16 ENCOUNTER — Other Ambulatory Visit: Payer: Self-pay | Admitting: Family

## 2024-04-16 DIAGNOSIS — M8000XA Age-related osteoporosis with current pathological fracture, unspecified site, initial encounter for fracture: Secondary | ICD-10-CM

## 2024-04-20 DIAGNOSIS — H401133 Primary open-angle glaucoma, bilateral, severe stage: Secondary | ICD-10-CM | POA: Diagnosis not present

## 2024-05-05 ENCOUNTER — Other Ambulatory Visit: Payer: Self-pay | Admitting: Family

## 2024-05-05 DIAGNOSIS — M8000XA Age-related osteoporosis with current pathological fracture, unspecified site, initial encounter for fracture: Secondary | ICD-10-CM

## 2024-05-05 MED ORDER — AMLODIPINE BESYLATE 2.5 MG PO TABS: 2.5000 mg | ORAL_TABLET | Freq: Every day | ORAL | 1 refills | Status: DC

## 2024-05-05 MED ORDER — OMEPRAZOLE 20 MG PO CPDR: 20.0000 mg | DELAYED_RELEASE_CAPSULE | Freq: Two times a day (BID) | ORAL | 2 refills | Status: DC

## 2024-05-05 MED ORDER — ALENDRONATE SODIUM 70 MG PO TABS: 70.0000 mg | ORAL_TABLET | ORAL | 2 refills | Status: AC

## 2024-05-05 MED ORDER — ROSUVASTATIN CALCIUM 10 MG PO TABS: 10.0000 mg | ORAL_TABLET | Freq: Every day | ORAL | 2 refills | Status: AC

## 2024-05-05 MED ORDER — LEVOCETIRIZINE DIHYDROCHLORIDE 5 MG PO TABS: 5.0000 mg | ORAL_TABLET | Freq: Every evening | ORAL | 3 refills | Status: AC

## 2024-05-05 NOTE — Progress Notes (Signed)
 Daughter states pt  needs medications needs refills. Prescription sent to pharmacy

## 2024-05-08 ENCOUNTER — Encounter (INDEPENDENT_AMBULATORY_CARE_PROVIDER_SITE_OTHER): Admitting: Ophthalmology

## 2024-05-08 DIAGNOSIS — H59031 Cystoid macular edema following cataract surgery, right eye: Secondary | ICD-10-CM

## 2024-05-08 DIAGNOSIS — I10 Essential (primary) hypertension: Secondary | ICD-10-CM | POA: Diagnosis not present

## 2024-05-08 DIAGNOSIS — H35033 Hypertensive retinopathy, bilateral: Secondary | ICD-10-CM | POA: Diagnosis not present

## 2024-05-08 DIAGNOSIS — H43813 Vitreous degeneration, bilateral: Secondary | ICD-10-CM

## 2024-05-08 DIAGNOSIS — H348322 Tributary (branch) retinal vein occlusion, left eye, stable: Secondary | ICD-10-CM

## 2024-06-15 DIAGNOSIS — B351 Tinea unguium: Secondary | ICD-10-CM | POA: Diagnosis not present

## 2024-06-15 DIAGNOSIS — M79675 Pain in left toe(s): Secondary | ICD-10-CM | POA: Diagnosis not present

## 2024-06-15 DIAGNOSIS — M79674 Pain in right toe(s): Secondary | ICD-10-CM | POA: Diagnosis not present

## 2024-06-17 ENCOUNTER — Other Ambulatory Visit: Payer: Self-pay | Admitting: Family

## 2024-06-17 DIAGNOSIS — M1711 Unilateral primary osteoarthritis, right knee: Secondary | ICD-10-CM

## 2024-06-21 DIAGNOSIS — H401132 Primary open-angle glaucoma, bilateral, moderate stage: Secondary | ICD-10-CM | POA: Diagnosis not present

## 2024-06-21 DIAGNOSIS — H0102A Squamous blepharitis right eye, upper and lower eyelids: Secondary | ICD-10-CM | POA: Diagnosis not present

## 2024-06-21 DIAGNOSIS — H04123 Dry eye syndrome of bilateral lacrimal glands: Secondary | ICD-10-CM | POA: Diagnosis not present

## 2024-06-21 DIAGNOSIS — Z961 Presence of intraocular lens: Secondary | ICD-10-CM | POA: Diagnosis not present

## 2024-06-21 DIAGNOSIS — H0102B Squamous blepharitis left eye, upper and lower eyelids: Secondary | ICD-10-CM | POA: Diagnosis not present

## 2024-06-22 ENCOUNTER — Ambulatory Visit (INDEPENDENT_AMBULATORY_CARE_PROVIDER_SITE_OTHER): Admitting: Family

## 2024-06-22 ENCOUNTER — Encounter: Payer: Self-pay | Admitting: Family

## 2024-06-22 VITALS — BP 135/74 | HR 69 | Temp 97.3°F | Ht 60.0 in | Wt 187.6 lb

## 2024-06-22 DIAGNOSIS — K2100 Gastro-esophageal reflux disease with esophagitis, without bleeding: Secondary | ICD-10-CM | POA: Diagnosis not present

## 2024-06-22 DIAGNOSIS — E7800 Pure hypercholesterolemia, unspecified: Secondary | ICD-10-CM

## 2024-06-22 DIAGNOSIS — F1320 Sedative, hypnotic or anxiolytic dependence, uncomplicated: Secondary | ICD-10-CM

## 2024-06-22 DIAGNOSIS — H4089 Other specified glaucoma: Secondary | ICD-10-CM

## 2024-06-22 DIAGNOSIS — Z79899 Other long term (current) drug therapy: Secondary | ICD-10-CM

## 2024-06-22 DIAGNOSIS — N3281 Overactive bladder: Secondary | ICD-10-CM | POA: Diagnosis not present

## 2024-06-22 DIAGNOSIS — Z91030 Bee allergy status: Secondary | ICD-10-CM

## 2024-06-22 DIAGNOSIS — N3941 Urge incontinence: Secondary | ICD-10-CM

## 2024-06-22 DIAGNOSIS — Z23 Encounter for immunization: Secondary | ICD-10-CM

## 2024-06-22 DIAGNOSIS — I10 Essential (primary) hypertension: Secondary | ICD-10-CM

## 2024-06-22 DIAGNOSIS — F419 Anxiety disorder, unspecified: Secondary | ICD-10-CM | POA: Diagnosis not present

## 2024-06-22 MED ORDER — OXYBUTYNIN CHLORIDE ER 10 MG PO TB24: 10.0000 mg | ORAL_TABLET | Freq: Every day | ORAL | 2 refills | Status: AC

## 2024-06-22 MED ORDER — EPINEPHRINE 0.3 MG/0.3ML IJ SOAJ: 0.3000 mg | Freq: Once | INTRAMUSCULAR | 1 refills | Status: AC

## 2024-06-22 MED ORDER — ALPRAZOLAM 0.25 MG PO TABS: 0.2500 mg | ORAL_TABLET | Freq: Every day | ORAL | 2 refills | Status: DC

## 2024-06-22 NOTE — Patient Instructions (Signed)
 Health Maintenance After Age 80 After age 27, you are at a higher risk for certain long-term diseases and infections as well as injuries from falls. Falls are a major cause of broken bones and head injuries in people who are older than age 73. Getting regular preventive care can help to keep you healthy and well. Preventive care includes getting regular testing and making lifestyle changes as recommended by your health care provider. Talk with your health care provider about: Which screenings and tests you should have. A screening is a test that checks for a disease when you have no symptoms. A diet and exercise plan that is right for you. What should I know about screenings and tests to prevent falls? Screening and testing are the best ways to find a health problem early. Early diagnosis and treatment give you the best chance of managing medical conditions that are common after age 90. Certain conditions and lifestyle choices may make you more likely to have a fall. Your health care provider may recommend: Regular vision checks. Poor vision and conditions such as cataracts can make you more likely to have a fall. If you wear glasses, make sure to get your prescription updated if your vision changes. Medicine review. Work with your health care provider to regularly review all of the medicines you are taking, including over-the-counter medicines. Ask your health care provider about any side effects that may make you more likely to have a fall. Tell your health care provider if any medicines that you take make you feel dizzy or sleepy. Strength and balance checks. Your health care provider may recommend certain tests to check your strength and balance while standing, walking, or changing positions. Foot health exam. Foot pain and numbness, as well as not wearing proper footwear, can make you more likely to have a fall. Screenings, including: Osteoporosis screening. Osteoporosis is a condition that causes  the bones to get weaker and break more easily. Blood pressure screening. Blood pressure changes and medicines to control blood pressure can make you feel dizzy. Depression screening. You may be more likely to have a fall if you have a fear of falling, feel depressed, or feel unable to do activities that you used to do. Alcohol  use screening. Using too much alcohol  can affect your balance and may make you more likely to have a fall. Follow these instructions at home: Lifestyle Do not drink alcohol  if: Your health care provider tells you not to drink. If you drink alcohol : Limit how much you have to: 0-1 drink a day for women. 0-2 drinks a day for men. Know how much alcohol  is in your drink. In the [place] one drink equals one 12 oz bottle of beer (355 mL), one 5 oz glass of wine (148 mL), or one 1 oz glass of hard liquor (44 mL). Do not use any products that contain nicotine or tobacco. These products include cigarettes, chewing tobacco, and vaping devices, such as e-cigarettes. If you need help quitting, ask your health care provider. Activity  Follow a regular exercise program to stay fit. This will help you maintain your balance. Ask your health care provider what types of exercise are appropriate for you. If you need a cane or walker, use it as recommended by your health care provider. Wear supportive shoes that have nonskid soles. Safety  Remove any tripping hazards, such as rugs, cords, and clutter. Install safety equipment such as grab bars in bathrooms and safety rails on stairs. Keep rooms and walkways  well-lit. General instructions Talk with your health care provider about your risks for falling. Tell your health care provider if: You fall. Be sure to tell your health care provider about all falls, even ones that seem minor. You feel dizzy, tiredness (fatigue), or off-balance. Take over-the-counter and prescription medicines only as told by your health care provider. These include  supplements. Eat a healthy diet and maintain a healthy weight. A healthy diet includes low-fat dairy products, low-fat (lean) meats, and fiber from whole grains, beans, and lots of fruits and vegetables. Stay current with your vaccines. Schedule regular health, dental, and eye exams. Summary Having a healthy lifestyle and getting preventive care can help to protect your health and wellness after age 15. Screening and testing are the best way to find a health problem early and help you avoid having a fall. Early diagnosis and treatment give you the best chance for managing medical conditions that are more common for people who are older than age 42. Falls are a major cause of broken bones and head injuries in people who are older than age 64. Take precautions to prevent a fall at home. Work with your health care provider to learn what changes you can make to improve your health and wellness and to prevent falls. This information is not intended to replace advice given to you by your health care provider. Make sure you discuss any questions you have with your health care provider. Document Revised: 02/03/2021 Document Reviewed: 02/03/2021 Elsevier Patient Education  2024 [employer].

## 2024-06-22 NOTE — Progress Notes (Signed)
 Subjective:    Patient ID: Laura Buchanan, female    DOB: [birth year], 78 y.o.   MRN: [id number]  Chief Complaint  Patient presents with   Medical Management of Chronic Issues   Pt presents to the office today chronic follow up.   She is followed by Urologists every 6 months for hx of kidney stones and OAB. She has been using oxybutynin  10 mg.  She is followed by ophthalmologist for glaucoma and legally blind in her left eye.   She has an allergy to bee stings and has rx for Epipen .  Hypertension This is a chronic problem. The current episode started more than 1 year ago. The problem has been resolved since onset. The problem is controlled. Associated symptoms include anxiety, malaise/fatigue, peripheral edema (a little bit) and shortness of breath (some times). Risk factors for coronary artery disease include obesity and sedentary lifestyle. The current treatment provides moderate improvement.  Gastroesophageal Reflux She complains of belching and heartburn. She reports no nausea. This is a chronic problem. The current episode started more than 1 year ago. The problem occurs occasionally. The symptoms are aggravated by certain foods. Risk factors include obesity. She has tried a PPI for the symptoms. The treatment provided moderate relief.  Urinary Frequency  This is a chronic problem. The current episode started more than 1 year ago. The problem occurs every urination. The problem has been gradually worsening. Associated symptoms include frequency and urgency. Pertinent negatives include no nausea or vomiting. Treatments tried: oxybutynin . The treatment provided moderate relief.  Hyperlipidemia This is a chronic problem. The current episode started more than 1 year ago. The problem is controlled. Recent lipid tests were reviewed and are normal. Exacerbating diseases include obesity. Associated symptoms include shortness of breath (some times). Current antihyperlipidemic treatment  includes statins. The current treatment provides moderate improvement of lipids. Risk factors for coronary artery disease include dyslipidemia, hypertension and a sedentary lifestyle.  Anxiety Presents for follow-up visit. Symptoms include excessive worry, nervous/anxious behavior, restlessness and shortness of breath (some times). Patient reports no nausea. Symptoms occur occasionally. The severity of symptoms is mild.        Review of Systems  Constitutional:  Positive for malaise/fatigue.  Respiratory:  Positive for shortness of breath (some times).   Gastrointestinal:  Positive for heartburn. Negative for nausea and vomiting.  Genitourinary:  Positive for frequency and urgency.  Psychiatric/Behavioral:  The patient is nervous/anxious.   All other systems reviewed and are negative.   Social History   Socioeconomic History   Marital status: Widowed    Spouse name: Not on file   Number of children: Not on file   Years of education: Not on file   Highest education level: GED or equivalent  Occupational History   Not on file  Tobacco Use   Smoking status: Never   Smokeless tobacco: Never  Substance and Sexual Activity   Alcohol use: Yes    Comment: occasionally   Drug use: Never   Sexual activity: Never  Other Topics Concern   Not on file  Social History Narrative   Not on file   Social Drivers of Health   Financial Resource Strain: Low Risk  (03/20/2024)   Overall Financial Resource Strain (CARDIA)    Difficulty of Paying Living Expenses: Not very hard  Food Insecurity: No Food Insecurity (03/20/2024)   Hunger Vital Sign    Worried About Running Out of Food in the Last Year: Never true    Ran Out of  Food in the Last Year: Never true  Transportation Needs: No Transportation Needs (03/20/2024)   PRAPARE - Administrator, Civil Service (Medical): No    Lack of Transportation (Non-Medical): No  Physical Activity: Inactive (03/20/2024)   Exercise Vital  Sign    Days of Exercise per Week: 0 days    Minutes of Exercise per Session: Not on file  Stress: No Stress Concern Present (03/20/2024)   [employer] of Occupational Health - Occupational Stress Questionnaire    Feeling of Stress: Not at all  Social Connections: Socially Isolated (03/20/2024)   Social Connection and Isolation Panel    Frequency of Communication with Friends and Family: Once a week    Frequency of Social Gatherings with Friends and Family: Once a week    Attends Religious Services: Never    Database administrator or Organizations: No    Attends Engineer, structural: Not on file    Marital Status: Widowed   History reviewed. No pertinent family history.      Objective:   Physical Exam Vitals reviewed.  Constitutional:      General: She is not in acute distress.    Appearance: She is well-developed. She is obese.  HENT:     Head: Normocephalic and atraumatic.     Right Ear: Tympanic membrane normal.     Left Ear: Tympanic membrane normal.  Eyes:     Pupils: Pupils are equal, round, and reactive to light.  Neck:     Thyroid : No thyromegaly.  Cardiovascular:     Rate and Rhythm: Normal rate and regular rhythm.     Heart sounds: Normal heart sounds. No murmur heard. Pulmonary:     Effort: Pulmonary effort is normal. No respiratory distress.     Breath sounds: Normal breath sounds. No wheezing.  Abdominal:     General: Bowel sounds are normal. There is no distension.     Palpations: Abdomen is soft.     Tenderness: There is no abdominal tenderness.  Musculoskeletal:        General: No tenderness.     Cervical back: Normal range of motion and neck supple.     Right lower leg: Edema (trace) present.     Left lower leg: Edema (trace) present.  Skin:    General: Skin is warm and dry.  Neurological:     Mental Status: She is alert and oriented to person, place, and time.     Cranial Nerves: No cranial nerve deficit.     Deep Tendon Reflexes:  Reflexes are normal and symmetric.  Psychiatric:        Behavior: Behavior normal.        Thought Content: Thought content normal.        Judgment: Judgment normal.     BP 135/74   Pulse 69   Temp (!) 97.3 F (36.3 C) (Temporal)   Ht 5' (1.524 m)   Wt 187 lb 9.6 oz (85.1 kg)   BMI 36.64 kg/m      Assessment & Plan:  Laura Buchanan comes in today with chief complaint of Medical Management of Chronic Issues   Diagnosis and orders addressed:  1. Anxiety - ALPRAZolam  (XANAX ) 0.25 MG tablet; Take 1 tablet (0.25 mg total) by mouth daily.  Dispense: 30 tablet; Refill: 2 - CMP14+EGFR  2. Controlled substance agreement signed - ALPRAZolam  (XANAX ) 0.25 MG tablet; Take 1 tablet (0.25 mg total) by mouth daily.  Dispense: 30 tablet; Refill: 2 -  CMP14+EGFR  3. OAB (overactive bladder) - oxybutynin  (DITROPAN -XL) 10 MG 24 hr tablet; Take 1 tablet (10 mg total) by mouth at bedtime.  Dispense: 90 tablet; Refill: 2 - CMP14+EGFR  4. Encounter for immunization - Flu vaccine HIGH DOSE PF(Fluzone Trivalent)  5. Benign essential hypertension (Primary) - CMP14+EGFR  6. Gastroesophageal reflux disease with esophagitis without hemorrhage - CMP14+EGFR  7. Pure hypercholesterolemia  - CMP14+EGFR  8. Benzodiazepine dependence (HCC) - CMP14+EGFR  9. Obesity, morbid (HCC - CMP14+EGFR  10. Other glaucoma of both eyes - CMP14+EGFR  11. Urge incontinence - CMP14+EGFR   PT had decreased xanax  0.25 mg to daily from TID. Discussed to continue to decrease to prn.  Patient reviewed in [place] controlled database, no flags noted. Contract and drug screen are up to date.  Continue current medications  Keep follow up with specialists  Health Maintenance reviewed Diet and exercise encouraged  Return in about 3 months (around 09/21/2024), or if symptoms worsen or fail to improve.    Bari Learn, FNP

## 2024-06-22 NOTE — Addendum Note (Signed)
 Addended by: LAVELL LYE A on: 06/22/2024 11:18 AM   Modules accepted: Orders, Level of Service

## 2024-06-23 ENCOUNTER — Ambulatory Visit: Payer: Self-pay | Admitting: Family

## 2024-06-23 LAB — CMP14+EGFR
ALT: 34 IU/L — ABNORMAL HIGH (ref 0–32)
AST: 25 IU/L (ref 0–40)
Albumin: 4.3 g/dL (ref 3.8–4.8)
Alkaline Phosphatase: 56 IU/L (ref 49–135)
BUN/Creatinine Ratio: 29 — ABNORMAL HIGH (ref 12–28)
BUN: 27 mg/dL (ref 8–27)
Bilirubin Total: 0.4 mg/dL (ref 0.0–1.2)
CO2: 23 mmol/L (ref 20–29)
Calcium: 9.5 mg/dL (ref 8.7–10.3)
Chloride: 103 mmol/L (ref 96–106)
Creatinine, Ser: 0.92 mg/dL (ref 0.57–1.00)
Globulin, Total: 2.4 g/dL (ref 1.5–4.5)
Glucose: 79 mg/dL (ref 70–99)
Potassium: 4.4 mmol/L (ref 3.5–5.2)
Sodium: 142 mmol/L (ref 134–144)
Total Protein: 6.7 g/dL (ref 6.0–8.5)
eGFR: 63 mL/min/1.73 (ref >59)

## 2024-06-26 ENCOUNTER — Ambulatory Visit: Admitting: Family

## 2024-07-03 ENCOUNTER — Telehealth: Payer: Self-pay | Admitting: Family Medicine

## 2024-07-03 NOTE — Telephone Encounter (Unsigned)
 Copied from CRM [id number]. Topic: General - Other >> Jul 03, 2024  4:24 PM Jasmin G wrote: Reason for CRM: Pt's daughter called regarding recent missed call from Ms. Micheline Knee F, CMA, I called [facility] pt she was no available. Please call pt back at [phone] to discuss.

## 2024-07-03 NOTE — Telephone Encounter (Signed)
 lmtcb

## 2024-07-03 NOTE — Telephone Encounter (Signed)
 Patient aware and verbalized understanding.

## 2024-07-03 NOTE — Telephone Encounter (Signed)
 PT taking Crestor  this helps peripheral artery disease.

## 2024-07-03 NOTE — Telephone Encounter (Signed)
 Copied from CRM #[id number]. Topic: General - Other >> Jul 03, 2024 10:19 AM Debby BROCKS wrote: Reason for CRM: Patients Daughter Lourdes Harries in [place]) received a letter from [employer] stating that after the in house check up that was done by the insurance they are concerned with the patient having perfireal arteries disease They stated that the PCP will also be receiving the letter. (They received it at the end of last week) they want to know if this is something they should be worried about and need an appointment or not Callback: [phone]

## 2024-07-03 NOTE — Telephone Encounter (Signed)
 Lmtcb

## 2024-07-31 ENCOUNTER — Other Ambulatory Visit: Payer: Self-pay | Admitting: Urology

## 2024-08-01 ENCOUNTER — Other Ambulatory Visit: Payer: Self-pay | Admitting: Family

## 2024-08-07 NOTE — Progress Notes (Signed)
 Pharmacy Quality Measure Review  This patient is appearing on a report for being at risk of failing the adherence measure for cholesterol (statin) medications this calendar year.   Medication: rosuvastatin  10 mg Last fill date: 08/01/24 for 90 day supply  Insurance report was not up to date. No action needed at this time. Refills remaining on prescription.   Woodie Jock, PharmD PGY1 Pharmacy Resident  08/07/2024

## 2024-08-09 ENCOUNTER — Encounter (INDEPENDENT_AMBULATORY_CARE_PROVIDER_SITE_OTHER): Admitting: Ophthalmology

## 2024-08-09 DIAGNOSIS — I10 Essential (primary) hypertension: Secondary | ICD-10-CM | POA: Diagnosis not present

## 2024-08-09 DIAGNOSIS — H35351 Cystoid macular degeneration, right eye: Secondary | ICD-10-CM

## 2024-08-09 DIAGNOSIS — H348322 Tributary (branch) retinal vein occlusion, left eye, stable: Secondary | ICD-10-CM

## 2024-08-09 DIAGNOSIS — H35033 Hypertensive retinopathy, bilateral: Secondary | ICD-10-CM

## 2024-08-09 DIAGNOSIS — H43813 Vitreous degeneration, bilateral: Secondary | ICD-10-CM

## 2024-09-13 ENCOUNTER — Other Ambulatory Visit: Payer: Self-pay | Admitting: Family

## 2024-09-13 DIAGNOSIS — M1711 Unilateral primary osteoarthritis, right knee: Secondary | ICD-10-CM

## 2024-09-25 ENCOUNTER — Ambulatory Visit (INDEPENDENT_AMBULATORY_CARE_PROVIDER_SITE_OTHER): Payer: Self-pay | Admitting: Family

## 2024-09-25 ENCOUNTER — Encounter: Payer: Self-pay | Admitting: Family

## 2024-09-25 VITALS — BP 137/72 | HR 74 | Temp 97.4°F | Ht 60.0 in | Wt 186.4 lb

## 2024-09-25 DIAGNOSIS — I10 Essential (primary) hypertension: Secondary | ICD-10-CM

## 2024-09-25 DIAGNOSIS — F1320 Sedative, hypnotic or anxiolytic dependence, uncomplicated: Secondary | ICD-10-CM

## 2024-09-25 DIAGNOSIS — F419 Anxiety disorder, unspecified: Secondary | ICD-10-CM | POA: Diagnosis not present

## 2024-09-25 DIAGNOSIS — N3281 Overactive bladder: Secondary | ICD-10-CM | POA: Diagnosis not present

## 2024-09-25 DIAGNOSIS — Z79899 Other long term (current) drug therapy: Secondary | ICD-10-CM

## 2024-09-25 DIAGNOSIS — K2100 Gastro-esophageal reflux disease with esophagitis, without bleeding: Secondary | ICD-10-CM

## 2024-09-25 DIAGNOSIS — E7800 Pure hypercholesterolemia, unspecified: Secondary | ICD-10-CM | POA: Diagnosis not present

## 2024-09-25 MED ORDER — ALPRAZOLAM 0.25 MG PO TABS: 0.2500 mg | ORAL_TABLET | Freq: Every day | ORAL | 2 refills | Status: AC

## 2024-09-25 NOTE — Patient Instructions (Signed)
 Health Maintenance After Age 80 After age 27, you are at a higher risk for certain long-term diseases and infections as well as injuries from falls. Falls are a major cause of broken bones and head injuries in people who are older than age 73. Getting regular preventive care can help to keep you healthy and well. Preventive care includes getting regular testing and making lifestyle changes as recommended by your health care provider. Talk with your health care provider about: Which screenings and tests you should have. A screening is a test that checks for a disease when you have no symptoms. A diet and exercise plan that is right for you. What should I know about screenings and tests to prevent falls? Screening and testing are the best ways to find a health problem early. Early diagnosis and treatment give you the best chance of managing medical conditions that are common after age 90. Certain conditions and lifestyle choices may make you more likely to have a fall. Your health care provider may recommend: Regular vision checks. Poor vision and conditions such as cataracts can make you more likely to have a fall. If you wear glasses, make sure to get your prescription updated if your vision changes. Medicine review. Work with your health care provider to regularly review all of the medicines you are taking, including over-the-counter medicines. Ask your health care provider about any side effects that may make you more likely to have a fall. Tell your health care provider if any medicines that you take make you feel dizzy or sleepy. Strength and balance checks. Your health care provider may recommend certain tests to check your strength and balance while standing, walking, or changing positions. Foot health exam. Foot pain and numbness, as well as not wearing proper footwear, can make you more likely to have a fall. Screenings, including: Osteoporosis screening. Osteoporosis is a condition that causes  the bones to get weaker and break more easily. Blood pressure screening. Blood pressure changes and medicines to control blood pressure can make you feel dizzy. Depression screening. You may be more likely to have a fall if you have a fear of falling, feel depressed, or feel unable to do activities that you used to do. Alcohol  use screening. Using too much alcohol  can affect your balance and may make you more likely to have a fall. Follow these instructions at home: Lifestyle Do not drink alcohol  if: Your health care provider tells you not to drink. If you drink alcohol : Limit how much you have to: 0-1 drink a day for women. 0-2 drinks a day for men. Know how much alcohol  is in your drink. In the [place] one drink equals one 12 oz bottle of beer (355 mL), one 5 oz glass of wine (148 mL), or one 1 oz glass of hard liquor (44 mL). Do not use any products that contain nicotine or tobacco. These products include cigarettes, chewing tobacco, and vaping devices, such as e-cigarettes. If you need help quitting, ask your health care provider. Activity  Follow a regular exercise program to stay fit. This will help you maintain your balance. Ask your health care provider what types of exercise are appropriate for you. If you need a cane or walker, use it as recommended by your health care provider. Wear supportive shoes that have nonskid soles. Safety  Remove any tripping hazards, such as rugs, cords, and clutter. Install safety equipment such as grab bars in bathrooms and safety rails on stairs. Keep rooms and walkways  well-lit. General instructions Talk with your health care provider about your risks for falling. Tell your health care provider if: You fall. Be sure to tell your health care provider about all falls, even ones that seem minor. You feel dizzy, tiredness (fatigue), or off-balance. Take over-the-counter and prescription medicines only as told by your health care provider. These include  supplements. Eat a healthy diet and maintain a healthy weight. A healthy diet includes low-fat dairy products, low-fat (lean) meats, and fiber from whole grains, beans, and lots of fruits and vegetables. Stay current with your vaccines. Schedule regular health, dental, and eye exams. Summary Having a healthy lifestyle and getting preventive care can help to protect your health and wellness after age 15. Screening and testing are the best way to find a health problem early and help you avoid having a fall. Early diagnosis and treatment give you the best chance for managing medical conditions that are more common for people who are older than age 42. Falls are a major cause of broken bones and head injuries in people who are older than age 64. Take precautions to prevent a fall at home. Work with your health care provider to learn what changes you can make to improve your health and wellness and to prevent falls. This information is not intended to replace advice given to you by your health care provider. Make sure you discuss any questions you have with your health care provider. Document Revised: 02/03/2021 Document Reviewed: 02/03/2021 Elsevier Patient Education  2024 [employer].

## 2024-09-25 NOTE — Progress Notes (Signed)
 "  Subjective:    Patient ID: Laura Buchanan, female    DOB: 1946-02-09, 78 y.o.   MRN: [id number]  Chief Complaint  Patient presents with   Medical Management of Chronic Issues   Pt presents to the office today chronic follow up.   She is followed by Urologists every 6 months for hx of kidney stones and OAB. She has been using oxybutynin  10 mg.  She is followed by ophthalmologist for glaucoma and legally blind in her left eye.   She has an allergy to bee stings and has rx for Epipen .  Hypertension This is a chronic problem. The current episode started more than 1 year ago. The problem has been resolved since onset. The problem is controlled. Associated symptoms include anxiety. Pertinent negatives include no malaise/fatigue, peripheral edema or shortness of breath. Risk factors for coronary artery disease include obesity and sedentary lifestyle. The current treatment provides moderate improvement.  Gastroesophageal Reflux She complains of belching and heartburn. She reports no nausea. This is a chronic problem. The current episode started more than 1 year ago. The problem occurs occasionally. The symptoms are aggravated by certain foods. Risk factors include obesity. She has tried a PPI for the symptoms. The treatment provided moderate relief.  Urinary Frequency  This is a chronic problem. The current episode started more than 1 year ago. The problem occurs every urination. The problem has been gradually worsening. Associated symptoms include frequency and urgency. Pertinent negatives include no nausea or vomiting. Treatments tried: oxybutynin . The treatment provided moderate relief.  Hyperlipidemia This is a chronic problem. The current episode started more than 1 year ago. The problem is controlled. Recent lipid tests were reviewed and are normal. Exacerbating diseases include obesity. Pertinent negatives include no shortness of breath. Current antihyperlipidemic treatment includes statins. The  current treatment provides moderate improvement of lipids. Risk factors for coronary artery disease include dyslipidemia, hypertension, a sedentary lifestyle and obesity.  Anxiety Presents for follow-up visit. Symptoms include excessive worry, nervous/anxious behavior and restlessness. Patient reports no nausea or shortness of breath. Symptoms occur occasionally. The severity of symptoms is mild.        Review of Systems  Constitutional:  Negative for malaise/fatigue.  Respiratory:  Negative for shortness of breath.   Gastrointestinal:  Positive for heartburn. Negative for nausea and vomiting.  Genitourinary:  Positive for frequency and urgency.  Psychiatric/Behavioral:  The patient is nervous/anxious.   All other systems reviewed and are negative.   Social History   Socioeconomic History   Marital status: Widowed    Spouse name: Not on file   Number of children: Not on file   Years of education: Not on file   Highest education level: Associate degree: occupational, scientist, product/process development, or vocational program  Occupational History   Not on file  Tobacco Use   Smoking status: Never   Smokeless tobacco: Never  Substance and Sexual Activity   Alcohol use: Yes    Comment: occasionally   Drug use: Never   Sexual activity: Never  Other Topics Concern   Not on file  Social History Narrative   Not on file   Social Drivers of Health   Tobacco Use: Low Risk (09/25/2024)   Patient History    Smoking Tobacco Use: Never    Smokeless Tobacco Use: Never    Passive Exposure: Not on file  Financial Resource Strain: Low Risk (09/22/2024)   Overall Financial Resource Strain (CARDIA)    Difficulty of Paying Living Expenses: Not very hard  Food Insecurity: No Food Insecurity (09/22/2024)   Epic    Worried About Programme Researcher, Broadcasting/film/video in the Last Year: Never true    Ran Out of Food in the Last Year: Never true  Transportation Needs: No Transportation Needs (09/22/2024)   Epic    Lack of  Transportation (Medical): No    Lack of Transportation (Non-Medical): No  Physical Activity: Inactive (09/22/2024)   Exercise Vital Sign    Days of Exercise per Week: 0 days    Minutes of Exercise per Session: Not on file  Stress: No Stress Concern Present (09/22/2024)   [employer] of Occupational Health - Occupational Stress Questionnaire    Feeling of Stress: Not at all  Social Connections: Socially Isolated (09/22/2024)   Social Connection and Isolation Panel    Frequency of Communication with Friends and Family: More than three times a week    Frequency of Social Gatherings with Friends and Family: Twice a week    Attends Religious Services: Never    Database Administrator or Organizations: No    Attends Engineer, Structural: Not on file    Marital Status: Widowed  Depression (PHQ2-9): Low Risk (03/20/2024)   Depression (PHQ2-9)    PHQ-2 Score: 0  Alcohol Screen: Low Risk (03/20/2024)   Alcohol Screen    Last Alcohol Screening Score (AUDIT): 1  Housing: Low Risk (09/22/2024)   Epic    Unable to Pay for Housing in the Last Year: No    Number of Times Moved in the Last Year: 0    Homeless in the Last Year: No  Utilities: Not At Risk (11/30/2023)   AHC Utilities    Threatened with loss of utilities: No  Health Literacy: Adequate Health Literacy (11/30/2023)   B1300 Health Literacy    Frequency of need for help with medical instructions: Never   History reviewed. No pertinent family history.      Objective:   Physical Exam Vitals reviewed.  Constitutional:      General: She is not in acute distress.    Appearance: She is well-developed. She is obese.  HENT:     Head: Normocephalic and atraumatic.     Right Ear: Tympanic membrane normal.     Left Ear: Tympanic membrane normal.  Eyes:     Pupils: Pupils are equal, round, and reactive to light.  Neck:     Thyroid : No thyromegaly.  Cardiovascular:     Rate and Rhythm: Normal rate and regular rhythm.      Heart sounds: Normal heart sounds. No murmur heard. Pulmonary:     Effort: Pulmonary effort is normal. No respiratory distress.     Breath sounds: Normal breath sounds. No wheezing.  Abdominal:     General: Bowel sounds are normal. There is no distension.     Palpations: Abdomen is soft.     Tenderness: There is no abdominal tenderness.  Musculoskeletal:        General: No tenderness.     Cervical back: Normal range of motion and neck supple.  Skin:    General: Skin is warm and dry.  Neurological:     Mental Status: She is alert and oriented to person, place, and time.     Cranial Nerves: No cranial nerve deficit.     Deep Tendon Reflexes: Reflexes are normal and symmetric.  Psychiatric:        Behavior: Behavior normal.        Thought Content: Thought content normal.  Judgment: Judgment normal.     BP 137/72   Pulse 74   Temp (!) 97.4 F (36.3 C) (Temporal)   Ht 5' (1.524 m)   Wt 186 lb 6.4 oz (84.6 kg)   BMI 36.40 kg/m      Assessment & Plan:  Molleigh Huot comes in today with chief complaint of Medical Management of Chronic Issues   Diagnosis and orders addressed:  1. Anxiety - ALPRAZolam  (XANAX ) 0.25 MG tablet; Take 1 tablet (0.25 mg total) by mouth daily.  Dispense: 30 tablet; Refill: 2  2. Controlled substance agreement signed - ALPRAZolam  (XANAX ) 0.25 MG tablet; Take 1 tablet (0.25 mg total) by mouth daily.  Dispense: 30 tablet; Refill: 2  3. Benign essential hypertension (Primary) At goal  4. Benzodiazepine dependence (HCC)   5. Gastroesophageal reflux disease with esophagitis without hemorrhage -Diet discussed- Avoid fried, spicy, citrus foods, caffeine and alcohol -Do not eat 2-3 hours before bedtime -Encouraged small frequent meals -Avoid NSAID's  6. OAB (overactive bladder) Continue current medications   7. Obesity, morbid (HCC) Healthy diet and exercise   8. Pure hypercholesterolemia Low fat diet    PT had decreased xanax  0.25 mg  to daily from TID. Discussed to continue to decrease to prn.  Patient reviewed in [place] controlled database, no flags noted. Contract and drug screen are up to date.  Continue current medications  Keep follow up with specialists  Health Maintenance reviewed Diet and exercise encouraged  Return in about 3 months (around 12/24/2024), or if symptoms worsen or fail to improve.    Bari Learn, FNP   "

## 2024-09-29 ENCOUNTER — Telehealth: Payer: Self-pay | Admitting: Family

## 2024-09-29 DIAGNOSIS — H4089 Other specified glaucoma: Secondary | ICD-10-CM

## 2024-09-29 NOTE — Telephone Encounter (Signed)
"  Referral placed   "

## 2024-09-29 NOTE — Telephone Encounter (Unsigned)
 Copied from CRM #[id number]. Topic: Appointments - Appointment Scheduling >> Sep 29, 2024  8:58 AM Diannia H wrote: Patient/patient representative is calling to schedule an appointment. Patients daughter is calling because she was told that the provider has to enter in her appointment for her eye care specialist. The appointment is 10/02/2024 Monday at 8:15. The location [facility] [phone]. She states she was told its a new process they are doing for medicare.

## 2024-10-03 ENCOUNTER — Telehealth: Payer: Self-pay | Admitting: Family

## 2024-10-03 DIAGNOSIS — H4089 Other specified glaucoma: Secondary | ICD-10-CM

## 2024-10-03 DIAGNOSIS — B351 Tinea unguium: Secondary | ICD-10-CM

## 2024-10-03 NOTE — Telephone Encounter (Signed)
 Please order new referrals. The referral for Dr Octavia has been processed.  Copied from CRM [id number]. Topic: Referral - Question >> Oct 02, 2024  4:26 PM Susanna ORN wrote: Reason for CRM: Patient's daughter, Zebedee, called in stating that her mother's insurance is now requiring referrals. She called to state that her mother will have several appts over the next few months that she will be needing referrals for. On April 13th, she will have an appt with [facility], April 3rd she will have an appt with Dr. Lanna for an eye appt, March 12th will be with Dr. Alvia at [facility] & on February 12th she will see Dr. Velma Boehringer, a podiatrist. Zebedee wants to know if she will need to call before each appt to have these referrals placed? She stated these dates may change. Please give her a call back for further questions or concerns. CB #: [phone].

## 2024-10-03 NOTE — Telephone Encounter (Signed)
 Referrals pended for [facility] to sign

## 2024-10-05 NOTE — Telephone Encounter (Signed)
 Daughter states she goes for re occurrent fungus on her toes.

## 2024-10-05 NOTE — Telephone Encounter (Signed)
 Referrals placed

## 2024-10-05 NOTE — Telephone Encounter (Signed)
 Why is she seeing Dr. Roddie?

## 2024-10-09 ENCOUNTER — Ambulatory Visit: Payer: Medicare HMO | Admitting: Urology

## 2024-10-28 ENCOUNTER — Other Ambulatory Visit: Payer: Self-pay | Admitting: Family

## 2024-11-28 ENCOUNTER — Ambulatory Visit

## 2024-11-30 ENCOUNTER — Ambulatory Visit: Payer: Self-pay

## 2024-12-07 ENCOUNTER — Encounter (INDEPENDENT_AMBULATORY_CARE_PROVIDER_SITE_OTHER): Admitting: Ophthalmology

## 2024-12-25 ENCOUNTER — Ambulatory Visit: Admitting: Family
